# Patient Record
Sex: Male | Born: 1940 | Race: White | Hispanic: No | Marital: Married | Smoking: Former smoker
Health system: Southern US, Community
[De-identification: ages and names within clinical notes are randomized; demographics above are authoritative.]

## PROBLEM LIST (undated history)

## (undated) DIAGNOSIS — I10 Essential (primary) hypertension: Secondary | ICD-10-CM

## (undated) DIAGNOSIS — G4730 Sleep apnea, unspecified: Secondary | ICD-10-CM

## (undated) DIAGNOSIS — K219 Gastro-esophageal reflux disease without esophagitis: Secondary | ICD-10-CM

## (undated) DIAGNOSIS — K2270 Barrett's esophagus without dysplasia: Secondary | ICD-10-CM

## (undated) DIAGNOSIS — I2510 Atherosclerotic heart disease of native coronary artery without angina pectoris: Secondary | ICD-10-CM

## (undated) DIAGNOSIS — IMO0001 Reserved for inherently not codable concepts without codable children: Secondary | ICD-10-CM

## (undated) DIAGNOSIS — F329 Major depressive disorder, single episode, unspecified: Secondary | ICD-10-CM

## (undated) DIAGNOSIS — E119 Type 2 diabetes mellitus without complications: Secondary | ICD-10-CM

## (undated) DIAGNOSIS — Z8739 Personal history of other diseases of the musculoskeletal system and connective tissue: Secondary | ICD-10-CM

## (undated) DIAGNOSIS — F411 Generalized anxiety disorder: Secondary | ICD-10-CM

## (undated) DIAGNOSIS — F32A Depression, unspecified: Secondary | ICD-10-CM

## (undated) DIAGNOSIS — I252 Old myocardial infarction: Secondary | ICD-10-CM

## (undated) DIAGNOSIS — E785 Hyperlipidemia, unspecified: Secondary | ICD-10-CM

## (undated) HISTORY — DX: Generalized anxiety disorder: F41.1

## (undated) HISTORY — DX: Hyperlipidemia, unspecified: E78.5

## (undated) HISTORY — DX: Atherosclerotic heart disease of native coronary artery without angina pectoris: I25.10

## (undated) HISTORY — DX: Depression, unspecified: F32.A

## (undated) HISTORY — PX: HERNIA REPAIR: SHX51

## (undated) HISTORY — PX: BACK SURGERY: SHX140

## (undated) HISTORY — DX: Gastro-esophageal reflux disease without esophagitis: K21.9

## (undated) HISTORY — PX: CARDIAC SURGERY: SHX584

## (undated) HISTORY — DX: Major depressive disorder, single episode, unspecified: F32.9

## (undated) HISTORY — DX: Reserved for inherently not codable concepts without codable children: IMO0001

## (undated) HISTORY — DX: Personal history of other diseases of the musculoskeletal system and connective tissue: Z87.39

## (undated) HISTORY — PX: CORONARY ARTERY BYPASS GRAFT: SHX141

## (undated) HISTORY — DX: Sleep apnea, unspecified: G47.30

## (undated) HISTORY — PX: ESOPHAGOGASTRODUODENOSCOPY: SHX1529

---

## 1997-04-25 ENCOUNTER — Inpatient Hospital Stay (HOSPITAL_COMMUNITY): Admission: EM | Admit: 1997-04-25 | Discharge: 1997-04-26 | Payer: Self-pay | Admitting: Emergency Medicine

## 1998-03-11 ENCOUNTER — Encounter: Payer: Self-pay | Admitting: Neurological Surgery

## 1998-03-11 ENCOUNTER — Ambulatory Visit (HOSPITAL_COMMUNITY): Admission: RE | Admit: 1998-03-11 | Discharge: 1998-03-11 | Payer: Self-pay | Admitting: Neurological Surgery

## 1998-03-18 ENCOUNTER — Ambulatory Visit (HOSPITAL_COMMUNITY): Admission: RE | Admit: 1998-03-18 | Discharge: 1998-03-18 | Payer: Self-pay | Admitting: Neurological Surgery

## 1998-03-18 ENCOUNTER — Encounter: Payer: Self-pay | Admitting: Neurological Surgery

## 1998-04-01 ENCOUNTER — Ambulatory Visit (HOSPITAL_COMMUNITY): Admission: RE | Admit: 1998-04-01 | Discharge: 1998-04-01 | Payer: Self-pay | Admitting: Neurological Surgery

## 1998-04-01 ENCOUNTER — Encounter: Payer: Self-pay | Admitting: Neurological Surgery

## 1998-04-20 ENCOUNTER — Ambulatory Visit (HOSPITAL_COMMUNITY): Admission: RE | Admit: 1998-04-20 | Discharge: 1998-04-20 | Payer: Self-pay | Admitting: Neurological Surgery

## 1998-04-20 ENCOUNTER — Encounter: Payer: Self-pay | Admitting: Neurological Surgery

## 2002-10-07 ENCOUNTER — Ambulatory Visit (HOSPITAL_COMMUNITY): Admission: RE | Admit: 2002-10-07 | Discharge: 2002-10-07 | Payer: Self-pay | Admitting: Internal Medicine

## 2003-07-29 ENCOUNTER — Ambulatory Visit (HOSPITAL_COMMUNITY): Admission: RE | Admit: 2003-07-29 | Discharge: 2003-07-29 | Payer: Self-pay | Admitting: Family Medicine

## 2005-11-15 ENCOUNTER — Ambulatory Visit: Payer: Self-pay | Admitting: Internal Medicine

## 2005-11-27 ENCOUNTER — Ambulatory Visit (HOSPITAL_COMMUNITY): Admission: RE | Admit: 2005-11-27 | Discharge: 2005-11-27 | Payer: Self-pay | Admitting: Internal Medicine

## 2005-11-27 ENCOUNTER — Encounter (INDEPENDENT_AMBULATORY_CARE_PROVIDER_SITE_OTHER): Payer: Self-pay | Admitting: *Deleted

## 2005-11-27 ENCOUNTER — Ambulatory Visit: Payer: Self-pay | Admitting: Internal Medicine

## 2006-07-24 ENCOUNTER — Ambulatory Visit (HOSPITAL_COMMUNITY): Admission: RE | Admit: 2006-07-24 | Discharge: 2006-07-24 | Payer: Self-pay | Admitting: Family Medicine

## 2006-08-01 ENCOUNTER — Ambulatory Visit (HOSPITAL_COMMUNITY): Admission: RE | Admit: 2006-08-01 | Discharge: 2006-08-01 | Payer: Self-pay | Admitting: Family Medicine

## 2007-10-29 ENCOUNTER — Ambulatory Visit (HOSPITAL_COMMUNITY): Admission: RE | Admit: 2007-10-29 | Discharge: 2007-10-29 | Payer: Self-pay | Admitting: Family Medicine

## 2007-11-14 ENCOUNTER — Ambulatory Visit (HOSPITAL_COMMUNITY): Admission: RE | Admit: 2007-11-14 | Discharge: 2007-11-14 | Payer: Self-pay | Admitting: Family Medicine

## 2008-10-27 ENCOUNTER — Ambulatory Visit (HOSPITAL_COMMUNITY): Admission: RE | Admit: 2008-10-27 | Discharge: 2008-10-27 | Payer: Self-pay | Admitting: Family Medicine

## 2010-06-09 NOTE — Consult Note (Signed)
Malik King, Malik King               ACCOUNT NO.:  [id number]   MEDICAL RECORD NO.:  [id number]           PATIENT TYPE:  AMB   LOCATION:  DAY                           FACILITY:  [facility]   PHYSICIAN:  Malik King, M.D.    DATE OF BIRTH:  02/27/1940   DATE OF CONSULTATION:  11/15/2005  DATE OF DISCHARGE:                                   CONSULTATION   REASON FOR CONSULTATION:  History of Barrett's esophagus.   REQUESTING PHYSICIAN:  Malik A. Gerda Diss, MD   HISTORY OF PRESENT ILLNESS:  Patient is a 70 year old Caucasian gentleman  with history of Barrett's esophagus who is due for a followup EGD.  His last  EGD was September 2004, at which time he had short-segment Barrett's  esophagus with a small sliding hiatal hernia.  The total length of Barrett's  is no more than 20 mm.  There was no evidence of dysplasia on biopsies.  He  states his heartburn is well controlled, denies any dysphagia or  odynophagia, nausea/vomiting, abdominal pain, constipation, melena or rectal  bleeding.  His stools are loose on Metformin, and they are trying to find a  different medication for him.  Since we saw the patient, he had a  colonoscopy April 2006 at [facility] by Dr. Sydnee King, which was unremarkable.  A study was done apparently for diarrhea  and weight loss.   CURRENT MEDICATIONS:  1. Multivitamin daily.  2. Aspirin 325 mg daily.  3. Simvastatin 80 mg daily.  4. Isosorbide 30 mg daily.  5. Metoprolol 50 mg b.i.d.  6. Talopram 40 mg daily.  7. Omeprazole 20 mg daily.  8. Lasix 40 mg daily.  9. Metformin 1000 mg in the morning, 500 mg in the evening.  10.Amlodipine 10 mg daily.  11.Aleve 4-6 daily p.r.n. for questionable muscle strain.   ALLERGIES:  NO KNOWN DRUG ALLERGIES.   PAST MEDICAL HISTORY:  History of coronary artery disease status post 3-  vessel CABG in 1998.  He had TMR in 1999, 4 stents placed in 2001 and  another TMR procedure in 2002.  He has  hypertension, diabetes mellitus, had  back surgery, inguinal hernia repair, hemorrhoidectomy, cataract  extractions, skin cancer removed from his right hand and tonsillectomy.  He  has depression.  Last colonoscopies and EGD is as outlined above.   FAMILY HISTORY:  Mother died of rheumatic fever, age 49, complications from  a blood clot/stroke.  Father had his first MI at age 58, died of CAD at age  56.  Another brother died of heart disease at age 31.  He had a grandfather  who died from heart disease, age 78.  Maternal aunt and maternal cousin both  have colon cancer.   SOCIAL HISTORY:  Married, no children.  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  He quit smoking one week ago, no alcohol  use.   REVIEW OF SYSTEMS:  See HPI for GI and constitutional.  He denies any weight  loss currently or recently.  Cardiopulmonary:  No chest pain  or shortness of  breath.   PHYSICAL EXAMINATION:  VITAL SIGNS:  Weight 199.5, height 5 feet, 8 inches,  temp was 98.1, blood pressure 118/70, pulse 64.  GENERAL:  Pleasant, mildly obese Caucasian male in no acute distress.  SKIN:  Warm and dry, no jaundice.  HEENT:  Sclerae nonicteric.  Oropharyngeal mucosa moist and pink.  No  lesions, erythema or exudate.  He has upper and lower dentures.  No  lymphadenopathy, thyromegaly.  CHEST:  Lungs are clear to auscultation.  CARDIAC:  Regular rate and rhythm.  Normal S1, S2.  No murmurs, rubs or  gallops.  ABDOMEN:  Positive bowel sounds, soft, nontender, nondistended.  No  organomegaly or masses.  No rebound tenderness or guarding, no abdominal  bruits or hernias.  EXTREMITIES:  No edema.   IMPRESSION:  [name] is a 70 year old gentleman with chronic GERD  complicated by short-segment Barrett's esophagus.  He is due for a repeat  EGD for surveillance purposes.   PLAN:  1. EGD in the near future.  2. He will hold his aspirin for 3 days prior to procedure.  3. Continue  Prilosec dose before.   I would like to thank Malik King for allowing [place] to take part in the care  of this patient.      Malik King, P.A.      Malik King, M.D.  Electronically Signed    LL/MEDQ  D:  11/15/2005  T:  11/16/2005  Job:  [id number]   cc:   Malik Picket A. Gerda Diss, MD  Fax: [phone]

## 2010-06-09 NOTE — H&P (Signed)
NAME:  Malik King, Malik King                    ACCOUNT NO.:  [id number]   MEDICAL RECORD NO.:  [id number]                  PATIENT TYPE:   LOCATION:                                       FACILITY:   PHYSICIAN:  Lionel December, M.D.                 DATE OF BIRTH:  02/13/1940   DATE OF ADMISSION:  DATE OF DISCHARGE:                                HISTORY & PHYSICAL   OFFICE NOTE   PRESENTING COMPLAINT:  Chronic GERD.  Patient also interested in screening  colonoscopy.   HISTORY OF PRESENT ILLNESS:  This patient is a 70 year old Caucasian male, a  patient of Dr. Lilyan Punt, who is here for scheduled visit.  He is well  known to me; however, he has not been seen in our office since January 1999.  He was diagnosed with Barrett's esophagus by Dr. Audry Pili of [facility] back  in 1997.  His last EGD was in June 2002 at [facility].  According to the patient  his Barrett's esophagus has remained stable.  He states that it is difficult  for him to go to [facility] and he, therefore, wanted to have this test done here.  He denies heartburn or dysphagia.  He was having problems with hoarseness  and dry cough. 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  He is feeling better.  He denies abdominal pain, melena, or rectal bleeding.  His bowels move  regularly.  He has a good appetite and has not lost any weight recently.  The patient also is interested in undergoing colonoscopy for screening  purposes.  He denies chest pain, orthopnea or PND.   CURRENT MEDICATIONS:  1. MVI daily.  2. ASA 325 mg daily.  3. Simvastatin 80 mg daily.  4. Isosorbide 20 mg t.i.d.  5. Felodipine 10 mg daily.  6. Metoprolol 50 mg b.i.d.  7. Citalopram.  8. Hydrobromide 40 mg daily.  9. Omeprazole 20 mg b.i.d.   PAST MEDICAL HISTORY:  Medical problems include hypertension,  hyperlipidemia, chronic GERD complicated by Barrett's esophagus, and  coronary  artery disease.  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 He had another MI in November 1999 when he was evaluated at  [facility] and had TMR.  This procedure was repeated in July 2000.  He states that  he has done well since then.  His last noninvasive testing was in December  2003, which according to the patient was normal.  It was done in [place],  [place] where he is presently living.   He has had a tonsillectomy and inguinal herniorrhaphy.  He also had  hemorrhoidectomy years ago.  He also has depression well-controlled with  therapy. His last colonoscopy was in 1986 and he had flexible sigmoidoscopy  in May 1993.   ALLERGIES:  None known.   FAMILY  HISTORY:  Mother died of rheumatic heart disease at age 63.  Father  had MI at age 68 and died of CAD at age 52. One brother has had CABG.  Two  brothers and one sister in good health.  He is married but does not have any  children.  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  He is not disabled.  He states that since his last cardiac intervention he has felt a lot better  and he is working part-time as a Electrical engineer, usually over the weekends.  He smoked all-in-all for 20 years, but quit in 1998.  He does not drink  alcohol.  He does chew tobacco.   PHYSICAL EXAMINATION:  GENERAL: A pleasant, well-developed, mildly obese,  Caucasian male who is in no acute distress.  VITAL SIGNS:  He weighs 210-1/2 pounds and he is 5 feet 8 inches tall.  Pulse 68 per minute.  Blood pressure 100/60.  He is afebrile.  Conjunctivae  are pink.  Sclerae are nonicteric.  Oropharyngeal mucosa is normal.  He has  upper and lower dentures in place.  NECK:  Without masses or thyromegaly.  Carotids are 2+ bilaterally and  without bruits.  CARDIOVASCULAR: Cardiac exam with regular rhythm.  Normal S1 and S2.  No  murmur or gallop noted.  LUNGS: Clear to auscultation.  ABDOMEN:  Full, soft, and nontender without organomegaly or masses.   RECTAL:  Examination is deferred.  He does not have clubbing. He has trace  edema to his right ankle.   ASSESSMENT:  This patient is a 70 year old Caucasian male who has chronic  gastroesophageal reflux disease complicated by short-segment Barrett's  esophagus.  His last EGD was over 2 years ago at [facility].  He is due to  surveillance exam.   He is average risk for colorectal carcinoma and his last colonoscopy was 18  years ago and FS was 11 years ago.   PLAN:  He will continue present therapy.  He will undergo surveillance EGD  along with screening colonoscopy at [facility] in the near future.  I have reviewed  the procedure and risks with the patient and he is agreeable.                                               Lionel December, M.D.    NR/MEDQ  D:  09/21/2002  T:  09/21/2002  Job:  [id number]   cc:   Lorin Picket A. Gerda Diss, M.D.  [address]  [place]  [place] [address]  Fax: [phone]

## 2010-06-09 NOTE — Op Note (Signed)
NAMEMARSTON, Malik King               ACCOUNT NO.:  [id number]   MEDICAL RECORD NO.:  [id number]          PATIENT TYPE:  AMB   LOCATION:  DAY                           FACILITY:  [facility]   PHYSICIAN:  Lionel December, M.D.    DATE OF BIRTH:  [birth year]   DATE OF PROCEDURE:  11/27/2005  DATE OF DISCHARGE:                                 OPERATIVE REPORT   PROCEDURE:  Surveillance esophagogastroduodenoscopy.   INDICATIONS:  [name] is a 70 year old Caucasian male with a history of GERD  complicated by a short segment of Barrett's esophagus who is here for  surveillance examination.  He is on antireflux measures and a PPI; and his  symptoms are well-controlled.  His last exam was in September 2004.  Procedure and risks were reviewed with the patient and informed consent was  obtained.   MEDS FOR CONSCIOUS SEDATION:  Benzocaine spray for oropharyngeal topical  anesthesia Demerol 25 mg IV Versed 6 mg IV.   FINDINGS:  Procedure performed in endoscopy suite.  The patient's vital  signs and O2 saturations were monitored during procedure and remained  stable.  The patient was placed in the left lateral recumbent position and  Olympus videoscope was passed via oropharynx without any difficulty into  esophagus.   ESOPHAGUS:  Mucosa of the proximal and middle segments were normal.  Distally there was a finger-like extension of Barrett's mucosa, proximal  margin which was at 39.  GE junction was at 41; and hiatus at 43.  There  were two other islands of Barrett's mucosa which were much shorter but  wider.  Comparison was made to pictures from 3 years ago; and there was only  slight change in these shorter patches close to the GE junction.  There was  no ulceration or mass noted.  Biopsy was taken of the patch of Barrett's  mucosa on the way out.   STOMACH:  It was empty and distended very well insufflation.  Folds of  proximal stomach were normal.  Examination of the mucosa at body, antrum,  and  pyloric channel as well as angularis, fundus, and cardia was normal.   DUODENUM:  Bulbar mucosa was normal.  Scope was passed into the second part  of the duodenum where mucosa and folds were normal.   Endoscope was withdrawn.  The patient tolerated the procedure well.   FINAL DIAGNOSIS:  1. Short segment Barrett's esophagus which is not much changed since the      previous exam of over 3 years ago.  Biopsy taken to rule out dysplasia.  2. Small sliding hiatal hernia.   RECOMMENDATION:  He will continue antireflux measures and a PPI as before.  I will be contacting patient results of biopsy; and will consider next exam  in 3 years.      Lionel December, M.D.  Electronically Signed     NR/MEDQ  D:  11/27/2005  T:  11/27/2005  Job:  [id number]   cc:   Lorin Picket A. Gerda Diss, MD  Fax: [phone]

## 2010-06-09 NOTE — Op Note (Signed)
NAME:  Malik King, Malik King                         ACCOUNT NO.:  [id number]   MEDICAL RECORD NO.:  [id number]                   PATIENT TYPE:  AMB   LOCATION:  DAY                                  FACILITY:  [facility]   PHYSICIAN:  Lionel December, M.D.                 DATE OF BIRTH:  01/23/1940   DATE OF PROCEDURE:  10/07/2002  DATE OF DISCHARGE:                                 OPERATIVE REPORT   PROCEDURE:  Esophagogastroduodenoscopy followed by total colonoscopy.   ENDOSCOPIST:  Lionel December, M.D.   INDICATIONS:  Malik King is a 70 year old Caucasian male with history of chronic  GERD complicated by Barrett's esophagus.  His last 2 exams were at [facility].  His last exam was 2 years ago.  He wants to have this examined locally.  He  is also undergoing screening colonoscopy. He has intermittent rectal  discharge.  The procedure and risks were reviewed with the patient and  informed consent was obtained.   PREOPERATIVE MEDICATIONS:  Cetacaine spray for oropharyngeal topical  anesthesia, Demerol 25 mg IV and Versed 10 mg IV in divided dose.   FINDINGS:  Procedure was performed in endoscopy suite.  The patient's vital  signs and O2 saturation were monitored during the procedure and remained  stable.   PROCEDURE #1: ESOPHAGOGASTRODUODENOSCOPY:  The patient was placed in the  left lateral recumbent position and Olympus videoscope was passed via the  oropharynx without any difficulty into the esophagus.   ESOPHAGUS:  Mucosa of the esophagus was normal except distally he had a wide  band of Barrett-type mucosa.  The proximal margin was at 36 cm from the  incisors.  Gastroesophageal junction was noted to be at 38 cm and  diaphragmatic hiatus was at 40.  There was a short rim of Barrett-type  mucosa circumferentially above the GE junction.  There was a tiny island on  the left side which was no more than 3 mm.  Pictures were taken.  Multiple  biopsies were taken from this segment on the way out.   STOMACH:  It was empty and distended very well with insufflation.  The folds  of the proximal stomach were normal.  Examination of the mucosa at body,  antrum, pyloric channel, as well as angularis, fundus, and cardia were  normal.   DUODENUM:  Examination of the bulb and second part of the duodenum was also  normal.   Endoscope was withdrawn and the patient was prepared for procedure #2.   TOTAL COLONOSCOPY:  Rectal examination was performed.  No abnormality noted  on external or digital exam.   Olympus videoscope was placed in the rectum and advanced under vision into  the sigmoid colon and beyond.  Preparation was satisfactory. Scope was  passed to the cecum which was identified by ileocecal valve and appendiceal  orifice.  As the scope was withdrawn the mucosa was, once again, carefully  examined  and was normal throughout.  The rectal mucosa similarly was normal.   The scope was retroflexed to examine anorectal junction and moderate sized  hemorrhoids were noted below the dentate line. The endoscope was  straightened and withdrawn.  The patient tolerated the procedures well.   FINAL DIAGNOSES:  1. Short segment Barrett's esophagus with small sliding hiatal hernia.     Total length of Barrett's segment is no more than 20 mm.  Biopsies taken     looking for dysplasia.  2. Normal examination of the stomach and first and second part of the     duodenum.  3. Normal colonoscopy except moderate sized external hemorrhoids.   RECOMMENDATIONS:  1. He will continue antireflux measures and omeprazole at 20 mg p.o. b.i.d.  2. We will try him on Citrucel 1 tablespoonful daily and see if he could     stop his intermittent rectal discharge.  3. I will be contacting the patient with biopsy results and further     recommendations.                                               Lionel December, M.D.    NR/MEDQ  D:  10/07/2002  T:  10/07/2002  Job:  [id number]   cc:   Lorin Picket A. Gerda Diss, M.D.   [address]  [place]  [place] [address]  Fax: [phone]

## 2010-09-04 ENCOUNTER — Other Ambulatory Visit: Payer: Self-pay | Admitting: Family Medicine

## 2010-09-04 DIAGNOSIS — I281 Aneurysm of pulmonary artery: Secondary | ICD-10-CM

## 2010-09-11 ENCOUNTER — Ambulatory Visit (HOSPITAL_COMMUNITY)
Admission: RE | Admit: 2010-09-11 | Discharge: 2010-09-11 | Disposition: A | Discharge status: Home / Self Care | Payer: Medicare Other | Source: Ambulatory Visit | Attending: Family Medicine | Admitting: Family Medicine

## 2010-09-11 DIAGNOSIS — K8020 Calculus of gallbladder without cholecystitis without obstruction: Secondary | ICD-10-CM | POA: Insufficient documentation

## 2010-09-11 DIAGNOSIS — J8410 Pulmonary fibrosis, unspecified: Secondary | ICD-10-CM | POA: Insufficient documentation

## 2010-09-11 DIAGNOSIS — J984 Other disorders of lung: Secondary | ICD-10-CM | POA: Insufficient documentation

## 2010-09-11 DIAGNOSIS — I281 Aneurysm of pulmonary artery: Secondary | ICD-10-CM

## 2010-09-11 MED ORDER — IOHEXOL 300 MG/ML  SOLN
80.0000 mL | Freq: Once | INTRAMUSCULAR | Status: AC | PRN
  Administered 2010-09-11: 80 mL via INTRAVENOUS

## 2010-11-07 LAB — POCT I-STAT CREATININE
Creatinine, Ser: 0.8
Operator id: 208401

## 2012-03-10 ENCOUNTER — Encounter (HOSPITAL_COMMUNITY): Payer: Self-pay | Admitting: *Deleted

## 2012-03-10 ENCOUNTER — Emergency Department (HOSPITAL_COMMUNITY)
Admission: EM | Admit: 2012-03-10 | Discharge: 2012-03-10 | Disposition: A | Discharge status: Home / Self Care | Payer: Medicare Other | Attending: Emergency Medicine | Admitting: Emergency Medicine

## 2012-03-10 DIAGNOSIS — R5381 Other malaise: Secondary | ICD-10-CM | POA: Insufficient documentation

## 2012-03-10 DIAGNOSIS — R531 Weakness: Secondary | ICD-10-CM

## 2012-03-10 DIAGNOSIS — E119 Type 2 diabetes mellitus without complications: Secondary | ICD-10-CM | POA: Insufficient documentation

## 2012-03-10 DIAGNOSIS — I10 Essential (primary) hypertension: Secondary | ICD-10-CM | POA: Insufficient documentation

## 2012-03-10 DIAGNOSIS — Z8719 Personal history of other diseases of the digestive system: Secondary | ICD-10-CM | POA: Insufficient documentation

## 2012-03-10 DIAGNOSIS — I252 Old myocardial infarction: Secondary | ICD-10-CM | POA: Insufficient documentation

## 2012-03-10 DIAGNOSIS — R5383 Other fatigue: Secondary | ICD-10-CM | POA: Insufficient documentation

## 2012-03-10 DIAGNOSIS — R630 Anorexia: Secondary | ICD-10-CM | POA: Insufficient documentation

## 2012-03-10 HISTORY — DX: Old myocardial infarction: I25.2

## 2012-03-10 HISTORY — DX: Essential (primary) hypertension: I10

## 2012-03-10 HISTORY — DX: Barrett's esophagus without dysplasia: K22.70

## 2012-03-10 HISTORY — DX: Type 2 diabetes mellitus without complications: E11.9

## 2012-03-10 LAB — BASIC METABOLIC PANEL
Chloride: 95 mEq/L — ABNORMAL LOW (ref 96–112)
GFR calc Af Amer: >90 mL/min (ref >90)
GFR calc non Af Amer: 87 mL/min — ABNORMAL LOW (ref >90)
Glucose, Bld: 191 mg/dL — ABNORMAL HIGH (ref 70–99)
Potassium: 4.2 mEq/L (ref 3.5–5.1)
Sodium: 130 mEq/L — ABNORMAL LOW (ref 135–145)

## 2012-03-10 LAB — URINALYSIS, ROUTINE W REFLEX MICROSCOPIC
Bilirubin Urine: NEGATIVE
Hgb urine dipstick: NEGATIVE
Protein, ur: NEGATIVE mg/dL
pH: 5.5 (ref 5.0–8.0)

## 2012-03-10 LAB — CBC WITH DIFFERENTIAL/PLATELET
Basophils Absolute: 0 10*3/uL (ref 0.0–0.1)
Basophils Relative: 0 % (ref 0–1)
Eosinophils Relative: 2 % (ref 0–5)
HCT: 38.8 % — ABNORMAL LOW (ref 39.0–52.0)
Hemoglobin: 13.6 g/dL (ref 13.0–17.0)
Lymphs Abs: 3.5 10*3/uL (ref 0.7–4.0)
MCHC: 35.1 g/dL (ref 30.0–36.0)
MCV: 90.7 fL (ref 78.0–100.0)
Monocytes Relative: 5 % (ref 3–12)
RDW: 13.1 % (ref 11.5–15.5)
WBC: 10.6 10*3/uL — ABNORMAL HIGH (ref 4.0–10.5)

## 2012-03-10 LAB — TROPONIN I: Troponin I: <0.3 ng/mL (ref <0.30)

## 2012-03-10 NOTE — ED Notes (Signed)
Patient with no complaints at this time. Respirations even and unlabored. Skin warm/dry. Discharge instructions reviewed with patient at this time. Patient given opportunity to voice concerns/ask questions. Patient discharged at this time and left Emergency Department with steady gait.   

## 2012-03-10 NOTE — ED Notes (Signed)
Sent from Dr Cathlyn Parsons office due to abn. EKG.  No chest pain, says he feels weak

## 2012-03-10 NOTE — ED Notes (Signed)
Dr. Blinda Leatherwood made aware of completed labs.

## 2012-03-10 NOTE — ED Provider Notes (Signed)
History    This chart was scribed for Malik Crease, MD by Gerlean Ren, ED Scribe. This patient was seen in room [facility] and the patient's care was started at 3:46 PM    CSN: [id number]  Arrival date & time 03/10/12  1519   First MD Initiated Contact with Patient 03/10/12 1543      Chief Complaint  Patient presents with  . Weakness     The history is provided by the patient. No language interpreter was used.  Malik King is a 72 y.o. male with h/o MI, DM, HTN who presents to the Emergency Department sent from Dr. Fletcher Anon office complaining of unusual generalized weakness with associated decreased appetite.  Dr. Gerda Diss was concerned about EKG changes and contacted the patient's cardiologist in [place], [place]. Plan was to send the patient to the ER for cardiac enzymes, follow up with cardiologist in AM if normal.  Pt reports some occasional dizziness that has not been present today.  No recent head injuries or falls.  Pt denies chest pain, dyspnea, fever, emesis, diarrhea, difficulty swallowing.  Pt is a current smokeless tobacco user and denies alcohol use.   Past Medical History  Diagnosis Date  . MI, old   . Diabetes mellitus without complication   . Hypertension   . Barrett esophagus     Past Surgical History  Procedure Laterality Date  . Cardiac surgery    . Hernia repair    . Back surgery    . Coronary artery bypass graft      History reviewed. No pertinent family history.  History  Substance Use Topics  . Smoking status: Never Smoker   . Smokeless tobacco: Current User    Types: Chew  . Alcohol Use: No      Review of Systems  Constitutional: Positive for appetite change and fatigue. Negative for fever.  HENT: Negative for trouble swallowing.   Respiratory: Negative for shortness of breath.   Cardiovascular: Negative for chest pain and leg swelling.  Neurological: Negative for dizziness.  All other systems reviewed and are  negative.    Allergies  Review of patient's allergies indicates no known allergies.  Home Medications  No current outpatient prescriptions on file.  BP 135/79  Pulse 92  Temp(Src) 97.8 F (36.6 C) (Oral)  Resp 20  Ht 5\' 8"  (1.727 m)  Wt 180 lb (81.647 kg)  BMI 27.38 kg/m2  SpO2 99%  Physical Exam  Nursing note and vitals reviewed. Constitutional: He is oriented to person, place, and time. He appears well-developed and well-nourished.  Non-toxic appearance. He does not appear ill.  HENT:  Head: Normocephalic and atraumatic.  Nose: No mucosal edema or rhinorrhea.  Mouth/Throat: Mucous membranes are normal. No dental abscesses or edematous.  Eyes: Conjunctivae and EOM are normal.  Neck: Normal range of motion and full passive range of motion without pain. Neck supple.  Cardiovascular: Normal rate, regular rhythm and normal heart sounds.   No murmur heard. Pulmonary/Chest: Effort normal and breath sounds normal. No respiratory distress. He has no wheezes. He has no rhonchi. He exhibits no crepitus.  Abdominal: Soft. Normal appearance and bowel sounds are normal. He exhibits no distension. There is no tenderness.  Musculoskeletal: Normal range of motion. He exhibits no edema.  Moves all extremities well.   Neurological: He is alert and oriented to person, place, and time. He has normal strength.  Skin: Skin is warm, dry and intact.  Psychiatric: He has a normal mood and affect.  His speech is normal and behavior is normal. His mood appears not anxious.    ED Course  Procedures (including critical care time) DIAGNOSTIC STUDIES: Oxygen Saturation is 99% on room air, normal by my interpretation.     Date: 03/10/2012  Rate: 92  Rhythm: normal sinus rhythm  QRS Axis: normal  Intervals: normal  ST/T Wave abnormalities: normal  Conduction Disutrbances: none  Narrative Interpretation: unremarkable      COORDINATION OF CARE: 3:50 PM- Patient informed of clinical course  including CBC, b-met, troponin, and urinalysis, understands medical decision-making process, and agrees with plan.  Labs Reviewed - No data to display No results found.   Diagnosis: Generalized Weakness    MDM  Patient sent to the ER by Dr. Gerda Diss for further management. Patient has been experiencing fatigue and weakness today without any chest pain, heart palpitations or shortness of breath. Dr. Gerda Diss performed an EKG which he felt was abnormal and sent to the ER. Prior to sending the patient to the ER, Dr. Gerda Diss to discuss the management of the patient with the patient's cardiologist in [place]. It was felt that the patient should have cardiac enzymes and to be followed up in the office if normal, indicative abnormal.  At arrival to the ER, patient's symptoms have resolved. EKG here in the ER is normal. There were nonspecific ST elevations in V2 and V3 on the outpatient EKG that were not present on the current EKG. Troponin was negative. As patient is back to his normal baseline, never had chest pain and his EKG is normal here, it is felt he can be discharged and followup with his cardiologist as has been arranged.  I personally performed the services described in this documentation, which was scribed in my presence. The recorded information has been reviewed and is accurate.         Malik Crease, MD 03/10/12 [phone]

## 2012-03-11 ENCOUNTER — Other Ambulatory Visit (HOSPITAL_COMMUNITY): Payer: Self-pay | Admitting: Family Medicine

## 2012-03-11 DIAGNOSIS — R634 Abnormal weight loss: Secondary | ICD-10-CM

## 2012-03-11 DIAGNOSIS — Z87891 Personal history of nicotine dependence: Secondary | ICD-10-CM

## 2012-03-12 ENCOUNTER — Ambulatory Visit (HOSPITAL_COMMUNITY)
Admission: RE | Admit: 2012-03-12 | Discharge: 2012-03-12 | Disposition: A | Discharge status: Home / Self Care | Payer: Medicare Other | Source: Ambulatory Visit | Attending: Family Medicine | Admitting: Family Medicine

## 2012-03-12 ENCOUNTER — Other Ambulatory Visit (HOSPITAL_COMMUNITY): Payer: Self-pay | Admitting: Family Medicine

## 2012-03-12 DIAGNOSIS — Z951 Presence of aortocoronary bypass graft: Secondary | ICD-10-CM | POA: Insufficient documentation

## 2012-03-12 DIAGNOSIS — R634 Abnormal weight loss: Secondary | ICD-10-CM | POA: Insufficient documentation

## 2012-03-12 DIAGNOSIS — R630 Anorexia: Secondary | ICD-10-CM | POA: Insufficient documentation

## 2012-03-12 DIAGNOSIS — J984 Other disorders of lung: Secondary | ICD-10-CM | POA: Insufficient documentation

## 2012-03-12 DIAGNOSIS — Z87891 Personal history of nicotine dependence: Secondary | ICD-10-CM

## 2012-03-12 MED ORDER — IOHEXOL 300 MG/ML  SOLN
100.0000 mL | Freq: Once | INTRAMUSCULAR | Status: AC | PRN
  Administered 2012-03-12: 100 mL via INTRAVENOUS

## 2012-04-10 ENCOUNTER — Other Ambulatory Visit: Payer: Self-pay | Admitting: *Deleted

## 2012-04-10 MED ORDER — HYDROCODONE-ACETAMINOPHEN 5-325 MG PO TABS: 1.0000 | ORAL_TABLET | ORAL | Status: DC | PRN

## 2012-04-12 ENCOUNTER — Other Ambulatory Visit: Payer: Self-pay | Admitting: *Deleted

## 2012-04-12 MED ORDER — HYDROCODONE-ACETAMINOPHEN 7.5-325 MG PO TABS: 1.0000 | ORAL_TABLET | ORAL | Status: DC | PRN

## 2012-04-17 ENCOUNTER — Other Ambulatory Visit: Payer: Self-pay | Admitting: Family Medicine

## 2012-06-17 ENCOUNTER — Encounter: Payer: Self-pay | Admitting: *Deleted

## 2012-06-19 ENCOUNTER — Ambulatory Visit (INDEPENDENT_AMBULATORY_CARE_PROVIDER_SITE_OTHER): Payer: Medicare Other | Admitting: Family Medicine

## 2012-06-19 ENCOUNTER — Encounter: Payer: Self-pay | Admitting: Family Medicine

## 2012-06-19 VITALS — BP 126/78 | HR 80 | Ht 68.0 in | Wt 177.2 lb

## 2012-06-19 DIAGNOSIS — F329 Major depressive disorder, single episode, unspecified: Secondary | ICD-10-CM

## 2012-06-19 DIAGNOSIS — F32A Depression, unspecified: Secondary | ICD-10-CM

## 2012-06-19 DIAGNOSIS — E119 Type 2 diabetes mellitus without complications: Secondary | ICD-10-CM

## 2012-06-19 DIAGNOSIS — F3289 Other specified depressive episodes: Secondary | ICD-10-CM

## 2012-06-19 DIAGNOSIS — R634 Abnormal weight loss: Secondary | ICD-10-CM

## 2012-06-19 DIAGNOSIS — G894 Chronic pain syndrome: Secondary | ICD-10-CM

## 2012-06-19 NOTE — Patient Instructions (Addendum)
Please improve your nutritional / calorie intake   B12 use 2,000 mcg one daily , you can get this at the drug store without a prescription  Stop metformin (the other name for this is Glucophage) this could be contributing to your nausea and weight loss  The intestinal doctor at the [place] may need to do a gastric emptying test as well as a EGD. Please let us know what test and results of any test at the [facility]  Your A1C was 6.4 which is great ( this is why we are stopping your metformin)  PSA in Feb 0.67  Weight August 2012-192             April 2014      194              Nov 2013      185              August 2013   192  Feb 2014       180               May 2014      177  Come back here in 3 months

## 2012-06-19 NOTE — Progress Notes (Signed)
  Subjective:    Patient ID: Malik King, male    DOB: 1940-02-02, 72 y.o.   MRN: [id number]  Diabetes He presents for his follow-up diabetic visit. He has type 2 diabetes mellitus. His disease course has been improving. Pertinent negatives for hypoglycemia include no dizziness or seizures. Associated symptoms include fatigue. Pertinent negatives for diabetes include no chest pain. There are no hypoglycemic complications. Symptoms are improving. There are no diabetic complications. Risk factors for coronary artery disease include diabetes mellitus. Current diabetic treatment includes insulin injections and oral agent (monotherapy). He is compliant with treatment all of the time. His weight is stable. He is following a generally healthy diet. When asked about meal planning, he reported none.  denies low spells Poor appetite ,under a lot of stress, denies rectal bleeding Has intermittent nausea , mild fatigue Denies abusing his meds. FMH benign Soc- wife had recent stroke    Review of Systems  Constitutional: Positive for fatigue. Negative for fever and activity change (not active).  HENT: Negative for congestion and rhinorrhea.   Respiratory: Negative for cough, choking and chest tightness.   Cardiovascular: Negative for chest pain and leg swelling.  Gastrointestinal: Positive for nausea. Negative for abdominal pain and abdominal distention.  Endocrine: Negative for cold intolerance.  Neurological: Negative for dizziness, seizures and light-headedness.       Objective:   Physical Exam  Vitals reviewed. Constitutional: He appears well-developed.  HENT:  Head: Normocephalic.  Neck: Neck supple. No thyromegaly present.  Cardiovascular: Normal rate, regular rhythm and normal heart sounds.   No murmur heard. Pulmonary/Chest: Effort normal. No respiratory distress. He has no wheezes.  Musculoskeletal: He exhibits no edema.  Lymphadenopathy:    He has no cervical adenopathy.   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 Doesn't abuse the meds, helps him function DM- stable, A1C very good, stop metformin Weight loss- related to deprtession? Stress? Possibly Metformin causing issue. Stop metformin. [facility] Has appt already Had CT of chest and Abd recently looked good. PSA nl within past year.

## 2012-07-14 ENCOUNTER — Other Ambulatory Visit: Payer: Self-pay | Admitting: Family Medicine

## 2012-07-14 NOTE — Telephone Encounter (Signed)
May refill this +3 additional refills 

## 2012-07-15 NOTE — Telephone Encounter (Signed)
Rx called into [facility] system

## 2012-09-14 ENCOUNTER — Other Ambulatory Visit: Payer: Self-pay | Admitting: Family Medicine

## 2012-09-15 ENCOUNTER — Other Ambulatory Visit: Payer: Self-pay | Admitting: *Deleted

## 2012-09-15 NOTE — Telephone Encounter (Signed)
Ok for this and 4 refills

## 2012-09-19 ENCOUNTER — Encounter: Payer: Self-pay | Admitting: Family Medicine

## 2012-09-19 ENCOUNTER — Ambulatory Visit (INDEPENDENT_AMBULATORY_CARE_PROVIDER_SITE_OTHER): Payer: Medicare Other | Admitting: Family Medicine

## 2012-09-19 VITALS — BP 122/72 | Ht 68.0 in | Wt 175.6 lb

## 2012-09-19 DIAGNOSIS — E119 Type 2 diabetes mellitus without complications: Secondary | ICD-10-CM

## 2012-09-19 LAB — POCT GLYCOSYLATED HEMOGLOBIN (HGB A1C): Hemoglobin A1C: 6.4

## 2012-09-19 NOTE — Progress Notes (Signed)
  Subjective:    Patient ID: Malik King, male    DOB: August 31, 1940, 72 y.o.   MRN: [id number]  HPI Here today for check up.   Has no concerns.  His A1c looks very good. He denies abusing any of his medication. Does not smoke tries to eat properly tries to take his medicines past medical history heart disease family history heart disease social does not smoke  Review of Systems Denies chest tightness denies pressure pain denies nausea vomiting diarrhea. Denies swelling in the legs.    Objective:   Physical Exam Lungs clear no crackles, heart is regular. Abdomen is soft extremities no edema skin warm dry neurologic grossly normal       Assessment & Plan:  #1 we will decrease good control half tablet twice daily. I think this will help him. We need to avoid low sugars. Diabetes very good control #2 heart disease stable followup with cardiologist #3 chronic pain and chronic anxiety patient states up-to-date on refills we will follow him up in approximately 3 months as he needs refills we will give these

## 2012-09-19 NOTE — Patient Instructions (Addendum)
Glipizide-(  glucotrol )- reduce down to 1/2 tablet twice a day.  Call if any problems  Recheck in early December

## 2012-09-29 ENCOUNTER — Other Ambulatory Visit: Payer: Self-pay | Admitting: Family Medicine

## 2012-10-13 ENCOUNTER — Telehealth: Payer: Self-pay | Admitting: Family Medicine

## 2012-10-13 NOTE — Telephone Encounter (Signed)
Patient advised that the hydrocodone will require an office visit every 3 months and that he will get 3 rxs for the med at that visit to get him to the next appt. We will not be able to mail these rxs to him.

## 2012-10-13 NOTE — Telephone Encounter (Signed)
Patient says that the drug store left him a notice saying that they could not fax [place] anymore for Rx's and that we would have to mail him. He would like someone to call him back regarding this.

## 2012-11-11 ENCOUNTER — Encounter: Payer: Self-pay | Admitting: Family Medicine

## 2012-11-11 ENCOUNTER — Ambulatory Visit (INDEPENDENT_AMBULATORY_CARE_PROVIDER_SITE_OTHER): Payer: Medicare Other | Admitting: Family Medicine

## 2012-11-11 VITALS — BP 132/80 | Ht 68.0 in | Wt 174.0 lb

## 2012-11-11 DIAGNOSIS — R634 Abnormal weight loss: Secondary | ICD-10-CM

## 2012-11-11 DIAGNOSIS — E119 Type 2 diabetes mellitus without complications: Secondary | ICD-10-CM

## 2012-11-11 DIAGNOSIS — G894 Chronic pain syndrome: Secondary | ICD-10-CM

## 2012-11-11 DIAGNOSIS — Z23 Encounter for immunization: Secondary | ICD-10-CM

## 2012-11-11 MED ORDER — HYDROCODONE-ACETAMINOPHEN 7.5-325 MG PO TABS: ORAL_TABLET | ORAL | Status: DC

## 2012-11-11 NOTE — Progress Notes (Signed)
  Subjective:    Patient ID: Malik King, male    DOB: 1940/10/15, 72 y.o.   MRN: [id number]  HPIHere for a med check. Last A1C was 6.4 on 09/19/12.  Patient patient denies any low sugar spells currently he states he is trying to do a good job of watching dietary intake. He has lost 1 pound since last being here but it is not dramatic. He denies being depressed but he does the stress because of his wife's illness. Back pain since 2008. Patient relates that the specialist is talking about the possibility of neck surgery versus ejection. He uses his pain medicine anywhere between 4 and 6 times per day he denies abusing it. Patient denies being suicidal. Patient relates heart disease stable currently. He is a former smoker. Recent lab work plus also the CT exam from earlier this year of the chest and abdomen was reviewed with the patient. Family history noncontributory. Patient relates compliance with medications. Reflux under good control. Denies chest tightness pain does get short winded with significant activity. Denies nausea vomiting diarrhea.  Flu vaccine given.     Review of Systems see above     Objective:   Physical Exam Neck no masses lungs are clear no crackles heart is regular. No murmurs. Pulse normal. Abdomen soft no guarding rebound extremities no edema skin warm dry blood pressure on recheck is good       Assessment & Plan:  #1 diabetes no low sugar spells. Encourage healthy habits. #2 hyperlipidemia labs or not indicated currently he was encouraged to continue his medicine watch diet #3 heart disease stable continue follow through with cardiologist #4 chronic pain-refills on medications given. #5 anxiety issues-refills on Xanax. He is under a lot of stress with his wife's illness but patient denies being depressed currently continue current meds recheck patient again in approximately 3 months

## 2012-12-31 ENCOUNTER — Ambulatory Visit: Payer: Medicare Other | Admitting: Family Medicine

## 2013-01-01 ENCOUNTER — Ambulatory Visit: Payer: Medicare Other | Admitting: Family Medicine

## 2013-01-28 ENCOUNTER — Encounter (INDEPENDENT_AMBULATORY_CARE_PROVIDER_SITE_OTHER): Payer: Self-pay

## 2013-01-28 ENCOUNTER — Encounter (INDEPENDENT_AMBULATORY_CARE_PROVIDER_SITE_OTHER): Payer: Self-pay | Admitting: *Deleted

## 2013-01-30 ENCOUNTER — Encounter: Payer: Self-pay | Admitting: Family Medicine

## 2013-01-30 ENCOUNTER — Ambulatory Visit (INDEPENDENT_AMBULATORY_CARE_PROVIDER_SITE_OTHER): Payer: Medicare Other | Admitting: Family Medicine

## 2013-01-30 VITALS — BP 110/68 | Ht 68.0 in | Wt 176.0 lb

## 2013-01-30 DIAGNOSIS — E119 Type 2 diabetes mellitus without complications: Secondary | ICD-10-CM

## 2013-01-30 DIAGNOSIS — I10 Essential (primary) hypertension: Secondary | ICD-10-CM

## 2013-01-30 DIAGNOSIS — G894 Chronic pain syndrome: Secondary | ICD-10-CM

## 2013-01-30 DIAGNOSIS — Z79899 Other long term (current) drug therapy: Secondary | ICD-10-CM

## 2013-01-30 LAB — HEPATIC FUNCTION PANEL
ALK PHOS: 77 U/L (ref 39–117)
ALT: 10 U/L (ref 0–53)
AST: 11 U/L (ref 0–37)
Albumin: 4.4 g/dL (ref 3.5–5.2)
BILIRUBIN DIRECT: 0.1 mg/dL (ref 0.0–0.3)
BILIRUBIN INDIRECT: 0.4 mg/dL (ref 0.0–0.9)
TOTAL PROTEIN: 6.6 g/dL (ref 6.0–8.3)
Total Bilirubin: 0.5 mg/dL (ref 0.3–1.2)

## 2013-01-30 LAB — LIPID PANEL
Cholesterol: 96 mg/dL (ref 0–200)
HDL: 37 mg/dL — ABNORMAL LOW (ref >39)
LDL CALC: 25 mg/dL (ref 0–99)
TRIGLYCERIDES: 168 mg/dL — AB (ref <150)
Total CHOL/HDL Ratio: 2.6 Ratio
VLDL: 34 mg/dL (ref 0–40)

## 2013-01-30 LAB — POCT GLYCOSYLATED HEMOGLOBIN (HGB A1C): Hemoglobin A1C: 7.9

## 2013-01-30 MED ORDER — HYDROCODONE-ACETAMINOPHEN 7.5-325 MG PO TABS: ORAL_TABLET | ORAL | Status: DC

## 2013-01-30 NOTE — Progress Notes (Signed)
   Subjective:    Patient ID: Malik King, male    DOB: 1940-09-10, 73 y.o.   MRN: [id number]  HPIDiabetic check up. No concerns. Testing blood sugar BID.  He relates his sugars are in the 100 120 range in the morning a little higher later in the day denies any particular problems  He relates his chronic pain is stable. Denies any problems with it. PMH benign  Review of Systems  Constitutional: Negative for activity change, appetite change and fatigue.  Endocrine: Negative for polydipsia and polyphagia.  Neurological: Negative for weakness.  Psychiatric/Behavioral: Negative for confusion.       Objective:   Physical Exam  Vitals reviewed. Constitutional: He appears well-nourished. No distress.  Cardiovascular: Normal rate, regular rhythm and normal heart sounds.   No murmur heard. Pulmonary/Chest: Effort normal and breath sounds normal. No respiratory distress.  Musculoskeletal: He exhibits no edema.  Lymphadenopathy:    He has no cervical adenopathy.  Neurological: He is alert.  Psychiatric: His behavior is normal.          Assessment & Plan:  Diabetes-subpar control patient was encouraged to get back into the habit of eating healthy staying physically active and try to bring his numbers down to goal preferably 120 in the morning patient understands the importance of doing this recheck the A1c again in 3 months time if not significant improvement by that point in time then add additional medicines  Chronic pain stable refills given on medications followup 3 months patient states it helps him with functioning denies abusing it

## 2013-01-31 LAB — MICROALBUMIN, URINE: Microalb, Ur: 0.5 mg/dL (ref 0.00–1.89)

## 2013-02-01 ENCOUNTER — Encounter: Payer: Self-pay | Admitting: Family Medicine

## 2013-02-23 ENCOUNTER — Other Ambulatory Visit: Payer: Self-pay | Admitting: Family Medicine

## 2013-02-23 NOTE — Telephone Encounter (Signed)
Last office visit 01-30-13

## 2013-02-23 NOTE — Telephone Encounter (Signed)
May refill x4 

## 2013-02-24 IMAGING — CT CT CHEST W/ CM
2 of 4 series · 13 of 36 positions shown, 16 images · IV contrast (Omnipaque 300)
Comparison: 10/29/2007 abdomen and pelvis CT.  Chest CT from
09/11/2010.

CT CHEST

CLINICAL DATA: Decreased appetite with weight loss.

CT CHEST, ABDOMEN AND PELVIS WITH CONTRAST
TECHNIQUE: Multidetector CT imaging of the chest, abdomen and
pelvis was performed following the standard protocol during bolus
administration of intravenous contrast.
Contrast: 100mL OMNIPAQUE IOHEXOL 300 MG/ML  SOLN

[Series 2: cap with 5.0 b40f · axial · 0.77mm/px · z∈[-639,-59]mm · 10 of 136 slices shown, 13 images]
[im 13/136  mediastinal]
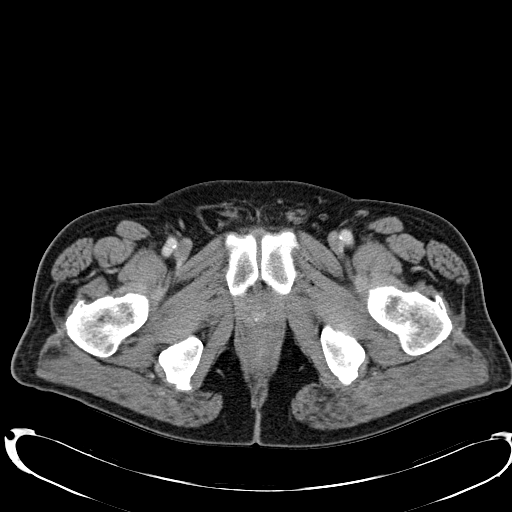
[im 13/136  lung]
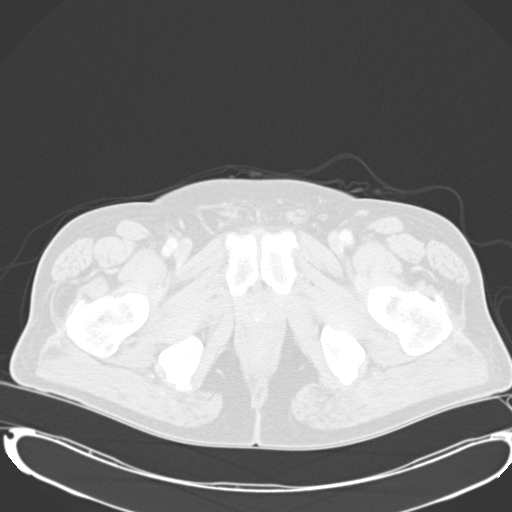
[im 26/136  lung]
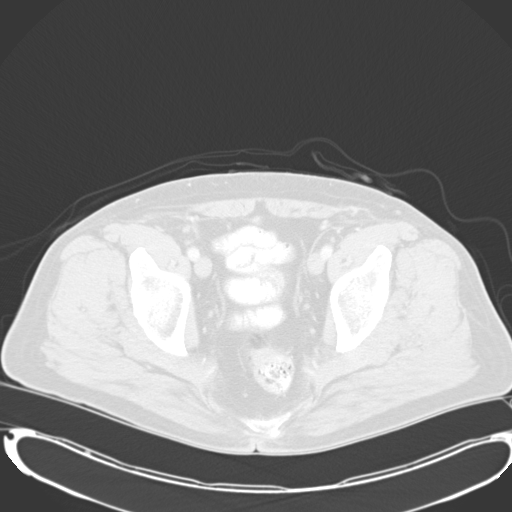
[im 39/136  lung]
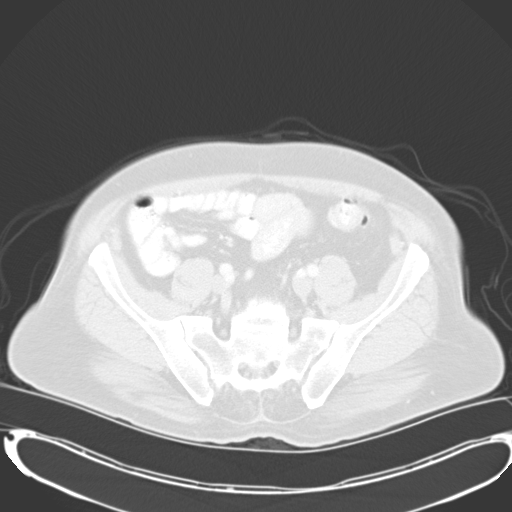
[im 52/136  lung]
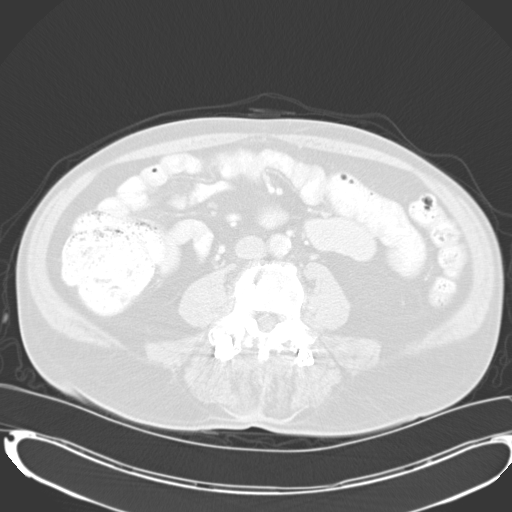
[im 65/136  mediastinal]
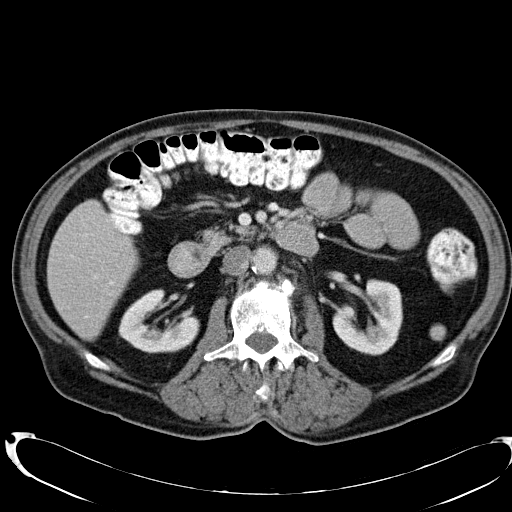
[im 65/136  lung]
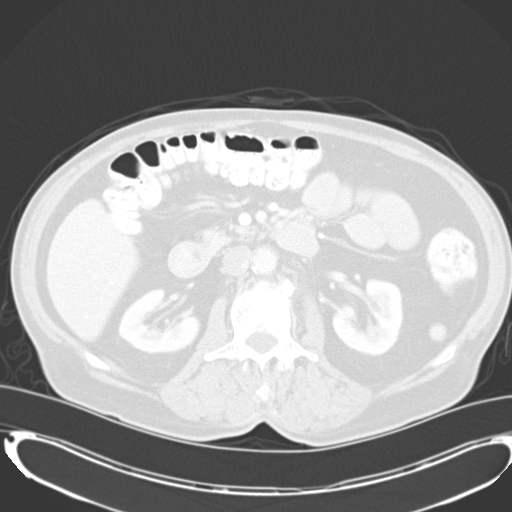
[im 78/136  lung]
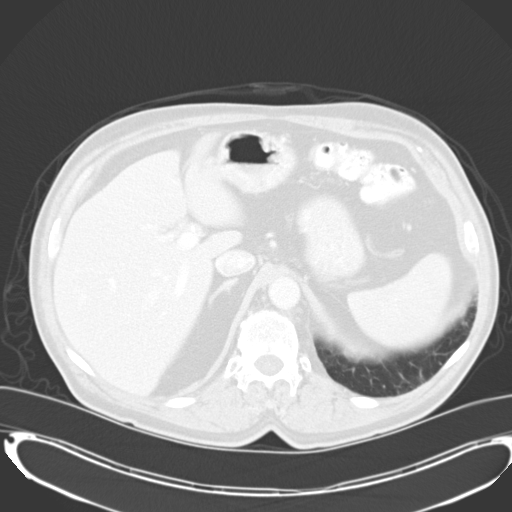
[im 91/136  lung]
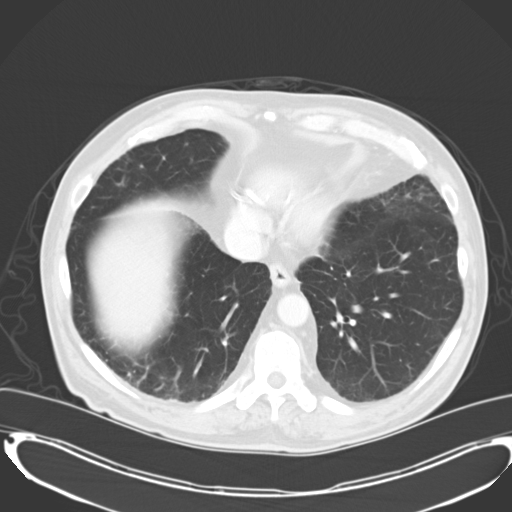
[im 103/136  lung]
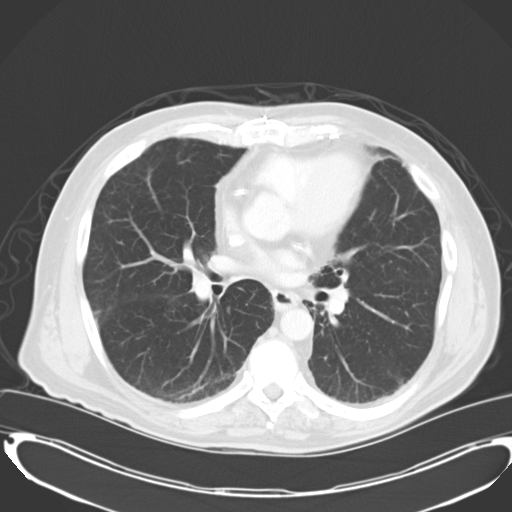
[im 116/136  mediastinal]
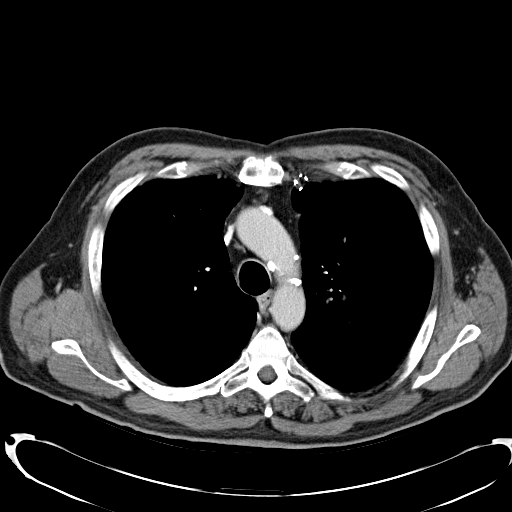
[im 116/136  lung]
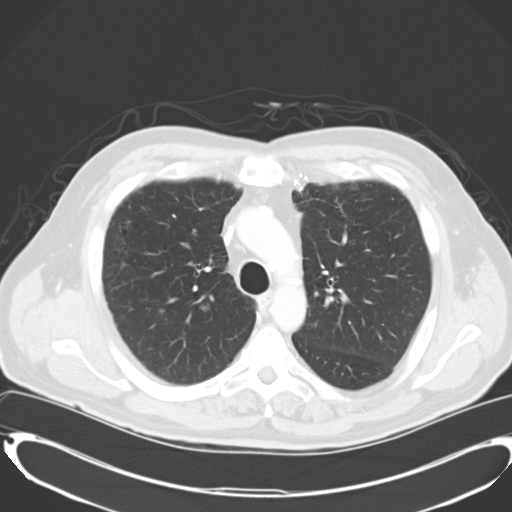
[im 129/136  lung]
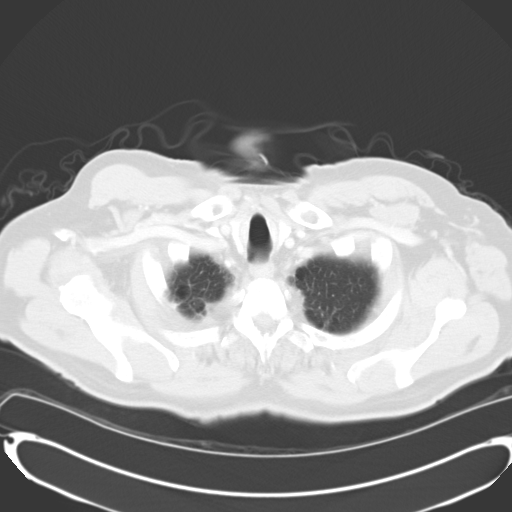

[Series 4: mpr cor post contrast (id) · coronal · 0.77mm/px · 3 of 83 slices shown]
[im 17/83  lung]
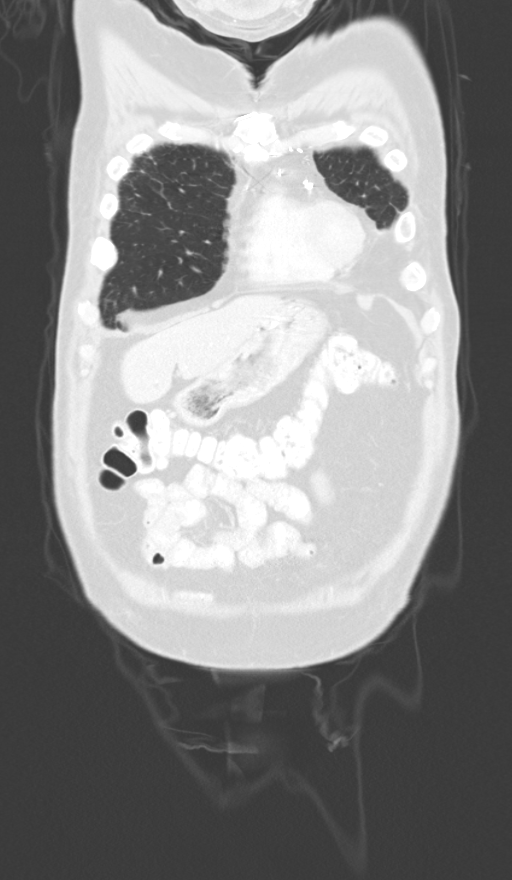
[im 33/83  lung]
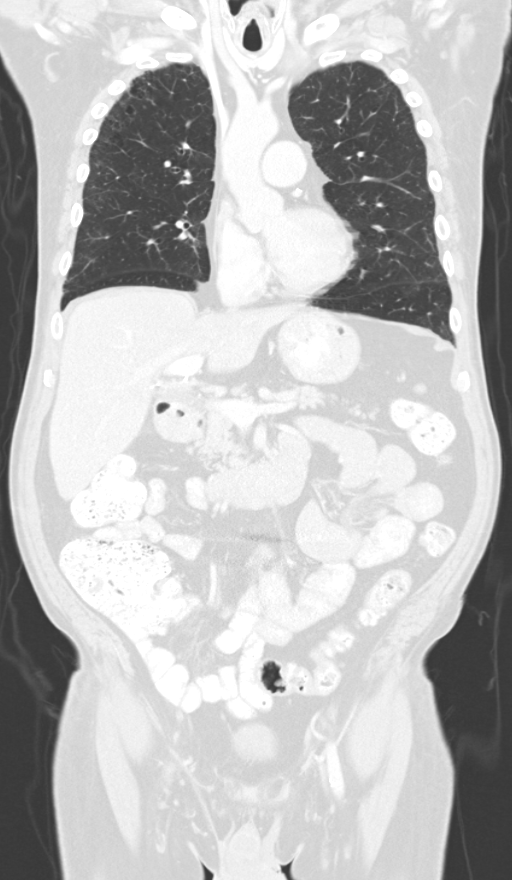
[im 50/83  lung]
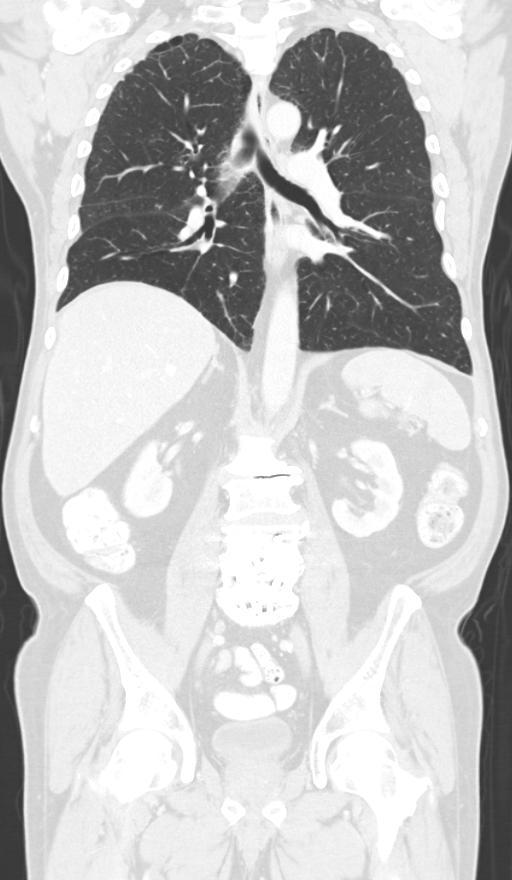

[13 of 36 positions shown; findings below may reference images not displayed]

FINDINGS: There is no axillary, mediastinal, or hilar
lymphadenopathy.  Heart size is normal.  The patient is status post
CABG.  No pericardial or pleural effusion.

Lung windows show biapical pleuroparenchymal scarring. Subpleural
honeycombing with and upper lobe predominance is unchanged.  7 mm
nodule in the left lower lobe seen on image 56 and is unchanged
since the comparison study measured 5 mm on [DATE] exam.  Imaging
features are consistent with a benign process.  No new pulmonary
nodule or mass.

Bone windows reveal no worrisome lytic or sclerotic osseous
lesions.
IMPRESSION: Stable CT chest.  No new or progressive findings.

CT ABDOMEN AND PELVIS
FINDINGS: The liver and spleen are unremarkable.  The stomach,
duodenum, pancreas, and adrenal glands are normal.  The patient is
status post cholecystectomy.  Small low density area in the
gallbladder fossa may be related to granulation tissue or
postsurgical scarring.  Kidneys are unremarkable.

No abdominal aortic aneurysm.  No free fluid or lymphadenopathy in
the abdomen.

Imaging through the pelvis shows no free intraperitoneal fluid.
Prostatic enlargement is noted.  The bladder is nondistended.  No
substantial diverticular change in the left colon.  No colonic
diverticulitis.  The terminal ileum is normal.  The appendix is
normal.

Right inguinal hernia contains only fat.

Bone windows reveal no worrisome lytic or sclerotic osseous
lesions.  The patient is status post lower lumbar fusion.
IMPRESSION: No acute findings in the abdomen or pelvis.  No findings to explain
the patient's history of decreased appetite and weight loss.

## 2013-05-01 ENCOUNTER — Encounter: Payer: Self-pay | Admitting: Family Medicine

## 2013-05-01 ENCOUNTER — Ambulatory Visit (INDEPENDENT_AMBULATORY_CARE_PROVIDER_SITE_OTHER): Payer: Medicare Other | Admitting: Family Medicine

## 2013-05-01 VITALS — BP 122/78 | Ht 68.0 in | Wt 177.0 lb

## 2013-05-01 DIAGNOSIS — Z23 Encounter for immunization: Secondary | ICD-10-CM

## 2013-05-01 DIAGNOSIS — G894 Chronic pain syndrome: Secondary | ICD-10-CM

## 2013-05-01 DIAGNOSIS — E119 Type 2 diabetes mellitus without complications: Secondary | ICD-10-CM

## 2013-05-01 DIAGNOSIS — I10 Essential (primary) hypertension: Secondary | ICD-10-CM | POA: Insufficient documentation

## 2013-05-01 LAB — POCT GLYCOSYLATED HEMOGLOBIN (HGB A1C): Hemoglobin A1C: 6.6

## 2013-05-01 MED ORDER — GLIPIZIDE 5 MG PO TABS: 2.5000 mg | ORAL_TABLET | Freq: Every day | ORAL | Status: DC

## 2013-05-01 MED ORDER — HYDROCODONE-ACETAMINOPHEN 7.5-325 MG PO TABS: ORAL_TABLET | ORAL | Status: DC

## 2013-05-01 NOTE — Progress Notes (Signed)
   Subjective:    Patient ID: Janeece AgeeJames W Mckeever, male    DOB: [birth year], 73 y.o.   MRN: [id number]  Diabetes He presents for his follow-up diabetic visit. He has type 2 diabetes mellitus. There are no hypoglycemic associated symptoms. Pertinent negatives for hypoglycemia include no confusion, dizziness or headaches. There are no diabetic associated symptoms. Pertinent negatives for diabetes include no chest pain, no fatigue, no polydipsia, no polyphagia and no weakness. Current diabetic treatment includes insulin injections and oral agent (dual therapy). (Pt says sugar will range from 70-200"s)   Long discussion held regarding his low back pain he is having significant pain discomfort have any his pain medicine anywhere between 4 and 5 times per day This patient was seen today for chronic pain  The medication list was reviewed and updated.  Discussion was held with the patient regarding compliance with pain medication. The patient was advised the importance of maintaining medication and not using illegal substances with these. The patient was educated that we can provide 3 monthly scripts for their medication, it is their responsibility to follow the instructions. Discussion was held with the patient to make sure they're not having significant side effects. Patient is aware that pain medications are meant to minimize the severity of the pain to allow their pain levels to improve to allow for better function. They are aware of that pain medications cannot totally remove their pain.      Review of Systems  Constitutional: Negative for activity change, appetite change and fatigue.  HENT: Negative for congestion.   Respiratory: Negative for cough and shortness of breath.   Cardiovascular: Negative for chest pain.  Gastrointestinal: Negative for abdominal pain.  Endocrine: Negative for polydipsia and polyphagia.  Genitourinary: Negative for difficulty urinating.  Musculoskeletal: Positive for  arthralgias and back pain.  Neurological: Negative for dizziness, weakness and headaches.  Psychiatric/Behavioral: Negative for confusion.       Objective:   Physical Exam  Vitals reviewed. Constitutional: He appears well-nourished. No distress.  Cardiovascular: Normal rate, regular rhythm and normal heart sounds.   No murmur heard. Pulmonary/Chest: Effort normal and breath sounds normal. No respiratory distress.  Abdominal: Soft.  Musculoskeletal: He exhibits no edema.  Lymphadenopathy:    He has no cervical adenopathy.  Neurological: He is alert.  Psychiatric: His behavior is normal.          Assessment & Plan:  #1 diabetes-A1c looks great. Actually it looks too good. I would recommend that he's decrease glipizide to a half tablet once in the morning. Also encourage him to try to eat a little bit better.  #2 situational stress related to his wife being in rehabilitation patient denies being depressed he is on antidepressant  #3 chronic back pain 3 prescriptions were written. 30 days apart. Followup in 3 months cautioned drowsiness not to over use.  #4 weight is stable but patient does need to do a better job of eating healthy.

## 2013-05-26 ENCOUNTER — Other Ambulatory Visit: Payer: Self-pay | Admitting: Family Medicine

## 2013-06-28 ENCOUNTER — Other Ambulatory Visit: Payer: Self-pay | Admitting: Family Medicine

## 2013-06-30 NOTE — Telephone Encounter (Signed)
Refill times 4 

## 2013-07-03 ENCOUNTER — Telehealth: Payer: Self-pay | Admitting: Family Medicine

## 2013-07-03 NOTE — Telephone Encounter (Signed)
This patient called to let us know that his wife passed away. Apparently she had a blood clot and died from complications. I talked with him at length expressed our condolences. For this I highly recommended that he followup with his psychiatric counselor and he can more than easily call us any time to talk or come sit down and talk. He voiced understanding and appreciation.

## 2013-07-03 NOTE — Telephone Encounter (Signed)
I talked with patient at length. He called stating that his wife died. She died of complications from a blood clot 5 days ago. I advised him to talk with his psychiatric counselor and he can more than easily come see us any time to sit down and talk. We expressed a lot of words of sadness. Patient does have family support. He expressed appreciation for me to call him regarding his wife's death.

## 2013-07-28 ENCOUNTER — Encounter: Payer: Self-pay | Admitting: Family Medicine

## 2013-07-28 ENCOUNTER — Ambulatory Visit (INDEPENDENT_AMBULATORY_CARE_PROVIDER_SITE_OTHER): Payer: Medicare Other | Admitting: Family Medicine

## 2013-07-28 VITALS — BP 120/70 | Ht 68.0 in | Wt 180.4 lb

## 2013-07-28 DIAGNOSIS — G894 Chronic pain syndrome: Secondary | ICD-10-CM

## 2013-07-28 DIAGNOSIS — E119 Type 2 diabetes mellitus without complications: Secondary | ICD-10-CM

## 2013-07-28 DIAGNOSIS — I10 Essential (primary) hypertension: Secondary | ICD-10-CM

## 2013-07-28 LAB — POCT GLYCOSYLATED HEMOGLOBIN (HGB A1C): HEMOGLOBIN A1C: 6

## 2013-07-28 MED ORDER — HYDROCODONE-ACETAMINOPHEN 7.5-325 MG PO TABS: ORAL_TABLET | ORAL | Status: DC

## 2013-07-28 NOTE — Progress Notes (Signed)
   Subjective:    Patient ID: Malik King, male    DOB: 1940-02-15, 73 y.o.   MRN: [id number]  Diabetes He presents for his follow-up diabetic visit. He has type 2 diabetes mellitus. His disease course has been stable. There are no hypoglycemic associated symptoms. There are no diabetic associated symptoms. There are no hypoglycemic complications. Symptoms are stable. There are no diabetic complications. There are no known risk factors for coronary artery disease. Current diabetic treatment includes insulin injections and oral agent (dual therapy). He is compliant with treatment all of the time.   Last A1C was 05/01/13 and it is too early to repeat today.  We went ahead and repeated the A1c. I realize Medicare will not reimburse for this patient lives too far to come back in a few days Patient states that he has no other concerns at this time.  This patient was seen today for chronic pain  The medication list was reviewed and updated.   -Compliance with pain medication: He he is compliant with his medicine  The patient was advised the importance of maintaining medication and not using illegal substances with these.  Refills needed: 3 refills needed today  The patient was educated that we can provide 3 monthly scripts for their medication, it is their responsibility to follow the instructions.  Side effects or complications from medications: No side effects from medication  Patient is aware that pain medications are meant to minimize the severity of the pain to allow their pain levels to improve to allow for better function. They are aware of that pain medications cannot totally remove their pain.  Due for UDT ( at least once per year) : This fall       Review of Systems Patient denies chest tightness pressure pain shortness breath nausea vomiting diarrhea    Objective:   Physical Exam  Lungs are clear hearts regular pulse normal extremities no edema foot exam normal neck no  masses      Assessment & Plan:  #1 diabetes A1c actually looks very good today. Unfortunately Medicare will cover it we will have to cover the cost.  #2 chronic pain-3 prescriptions given patient uses medication responsibly it does help him function he denies abusing the medicine  #3 depression-she has been dealing with sadness since his wife's death over a month ago but he denies being suicidal he does not feel he needs to see a counselor he is going in brief counseling at his [facility]  #4 heart disease-his heart disease is stable he sees a cardiologist every 6 months  #5 HTN-blood pressure is stable.

## 2013-10-26 ENCOUNTER — Ambulatory Visit (INDEPENDENT_AMBULATORY_CARE_PROVIDER_SITE_OTHER): Payer: Medicare Other | Admitting: Family Medicine

## 2013-10-26 ENCOUNTER — Encounter: Payer: Self-pay | Admitting: Family Medicine

## 2013-10-26 VITALS — BP 124/74 | Ht 68.0 in | Wt 188.0 lb

## 2013-10-26 DIAGNOSIS — E118 Type 2 diabetes mellitus with unspecified complications: Secondary | ICD-10-CM

## 2013-10-26 DIAGNOSIS — Z23 Encounter for immunization: Secondary | ICD-10-CM

## 2013-10-26 DIAGNOSIS — E119 Type 2 diabetes mellitus without complications: Secondary | ICD-10-CM

## 2013-10-26 DIAGNOSIS — I10 Essential (primary) hypertension: Secondary | ICD-10-CM

## 2013-10-26 DIAGNOSIS — G894 Chronic pain syndrome: Secondary | ICD-10-CM

## 2013-10-26 DIAGNOSIS — Z125 Encounter for screening for malignant neoplasm of prostate: Secondary | ICD-10-CM

## 2013-10-26 LAB — POCT GLYCOSYLATED HEMOGLOBIN (HGB A1C): HEMOGLOBIN A1C: 6.5

## 2013-10-26 MED ORDER — ALPRAZOLAM 1 MG PO TABS: ORAL_TABLET | ORAL | Status: DC

## 2013-10-26 MED ORDER — HYDROCODONE-ACETAMINOPHEN 7.5-325 MG PO TABS: ORAL_TABLET | ORAL | Status: DC

## 2013-10-26 NOTE — Progress Notes (Signed)
   Subjective:    Patient ID: Malik King, male    DOB: [birth year], 73 y.o.   MRN: [id number]  Diabetes He presents for his follow-up diabetic visit. He has type 2 diabetes mellitus. Pertinent negatives for hypoglycemia include no confusion. Pertinent negatives for diabetes include no chest pain, no fatigue, no polydipsia, no polyphagia and no weakness. He is compliant with treatment all of the time. He is currently taking insulin at bedtime. Insulin injections are given by patient. He never participates in exercise. His home blood glucose trend is fluctuating dramatically (low at night and high in the am). He does not see a podiatrist.Eye exam is current.  Flu vaccine and prevnar 13 today. Refill on hydrocodone. Pt takes as prescribed. No side effects.  Please see discussion of all his chronic health issues which were covered at the bottom of the assessment plan 25 minutes spent with patient discussing all the significant issues  Review of Systems  Constitutional: Negative for activity change, appetite change and fatigue.  HENT: Negative for congestion.   Respiratory: Negative for cough.   Cardiovascular: Negative for chest pain.  Gastrointestinal: Negative for abdominal pain.  Endocrine: Negative for polydipsia and polyphagia.  Neurological: Negative for weakness.  Psychiatric/Behavioral: Negative for confusion.       Objective:   Physical Exam  Vitals reviewed. Constitutional: He appears well-nourished. No distress.  Cardiovascular: Normal rate, regular rhythm and normal heart sounds.   No murmur heard. Pulmonary/Chest: Effort normal and breath sounds normal. No respiratory distress.  Musculoskeletal: He exhibits no edema.  Lymphadenopathy:    He has no cervical adenopathy.  Neurological: He is alert.  Psychiatric: His behavior is normal.          Assessment & Plan:  #1 anxiety-patient has been dealing with anxiety since the loss of his wife. He denies abusing Xanax  states it helps him with his nerves he takes it 2 or 3 times per day  #2 patient follows up with cardiology on a regular basis is taking his blood pressure and heart medicine and Plavix.  #3 hyperlipidemia his cardiologist follows lipid profile he knows to watch his diet he denies any problems with his medicine  #4 pain control-he states he is doing good job taking his medicine takes as directed does not exceed the number per day allowing. States it helps him function. He states it does not cause drowsiness  #5 diabetes good control overall denies low sugar spells tries to eat properly he is trying to stay physically active.  #6 reflux past takes Protonix on a regular basis when he does not take it it does cause significant issues  #7 insomnia trazodone helps with his means as well as sleep

## 2013-10-26 NOTE — Patient Instructions (Signed)
Stop Glipizide  Monitor your sugars twice daily  Let us know if low spells or above 200

## 2013-10-27 LAB — BASIC METABOLIC PANEL
BUN: 11 mg/dL (ref 6–23)
CALCIUM: 10.1 mg/dL (ref 8.4–10.5)
CO2: 23 meq/L (ref 19–32)
CREATININE: 0.78 mg/dL (ref 0.50–1.35)
Chloride: 94 mEq/L — ABNORMAL LOW (ref 96–112)
Glucose, Bld: 164 mg/dL — ABNORMAL HIGH (ref 70–99)
Potassium: 4.6 mEq/L (ref 3.5–5.3)
Sodium: 132 mEq/L — ABNORMAL LOW (ref 135–145)

## 2013-10-27 LAB — PSA, MEDICARE: PSA: 0.65 ng/mL (ref <=4.00)

## 2013-12-22 LAB — HM DIABETES EYE EXAM

## 2013-12-23 ENCOUNTER — Other Ambulatory Visit: Payer: Self-pay | Admitting: Family Medicine

## 2014-01-26 ENCOUNTER — Ambulatory Visit (INDEPENDENT_AMBULATORY_CARE_PROVIDER_SITE_OTHER): Payer: Medicare Other | Admitting: Family Medicine

## 2014-01-26 ENCOUNTER — Encounter: Payer: Self-pay | Admitting: Family Medicine

## 2014-01-26 VITALS — BP 138/88 | Ht 68.0 in | Wt 190.0 lb

## 2014-01-26 DIAGNOSIS — I10 Essential (primary) hypertension: Secondary | ICD-10-CM

## 2014-01-26 DIAGNOSIS — G894 Chronic pain syndrome: Secondary | ICD-10-CM

## 2014-01-26 DIAGNOSIS — E119 Type 2 diabetes mellitus without complications: Secondary | ICD-10-CM

## 2014-01-26 LAB — POCT GLYCOSYLATED HEMOGLOBIN (HGB A1C): HEMOGLOBIN A1C: 7.6

## 2014-01-26 MED ORDER — HYDROCODONE-ACETAMINOPHEN 7.5-325 MG PO TABS: ORAL_TABLET | ORAL | Status: DC

## 2014-01-26 NOTE — Progress Notes (Signed)
   Subjective:    Patient ID: Malik King, male    DOB: 11-09-1940, 74 y.o.   MRN: [id number]  Diabetes He presents for his follow-up diabetic visit. He has type 2 diabetes mellitus. Pertinent negatives for hypoglycemia include no confusion. Pertinent negatives for diabetes include no chest pain, no fatigue, no polydipsia, no polyphagia and no weakness. He is compliant with treatment all of the time. He never participates in exercise. His home blood glucose trend is increasing rapidly. His breakfast blood glucose range is generally >200 mg/dl. He does not see a podiatrist.Eye exam is current.  A1C 7.6. pt states no other concerns.  Patient has chronic pain and discomfort takes her medicine for pain control helps him with functioning. He denies abusing it. Denies any other issues with it. He states he has not been having heart disease issues lately he follows up with a cardiologist. Has history hypertension blood pressure today was good compliance with medicine good. Has history of anxiety takes his medicine denies abusing it helps him he states it does not cause drowsiness. Review of Systems  Constitutional: Negative for activity change, appetite change and fatigue.  HENT: Negative for congestion.   Respiratory: Negative for cough.   Cardiovascular: Negative for chest pain.  Gastrointestinal: Negative for abdominal pain.  Endocrine: Negative for polydipsia and polyphagia.  Neurological: Negative for weakness.  Psychiatric/Behavioral: Negative for confusion.       Objective:   Physical Exam Diabetic foot exam normal Lungs clear hearts regular Pulse normal BP good abdomen soft Extremities no edema Neurologic grossly normal       Assessment & Plan:  Diabetes poor control he needs to do a better job were increasing insulin watch diet better since the passing of his wife he has not been eating properly discussed at length the importance of doing this [place]  [facility] states that it looked good.  Chronic pain discomfort he denies abusing medicine I don't believe he has we called and verified that he was only getting his medicine through us. 3 prescriptions were given he will follow-up in 3 months.

## 2014-03-20 ENCOUNTER — Other Ambulatory Visit: Payer: Self-pay | Admitting: Family Medicine

## 2014-03-24 ENCOUNTER — Other Ambulatory Visit: Payer: Self-pay | Admitting: Family Medicine

## 2014-03-24 MED ORDER — TRAZODONE HCL 100 MG PO TABS: 100.0000 mg | ORAL_TABLET | Freq: Every day | ORAL | Status: DC

## 2014-03-24 NOTE — Addendum Note (Signed)
Addended byOneal Deputy: Sharmila Wrobleski D on: 03/24/2014 01:29 PM   Modules accepted: Orders

## 2014-04-16 ENCOUNTER — Other Ambulatory Visit: Payer: Self-pay | Admitting: Family Medicine

## 2014-04-27 ENCOUNTER — Encounter: Payer: Self-pay | Admitting: Family Medicine

## 2014-04-27 ENCOUNTER — Ambulatory Visit (INDEPENDENT_AMBULATORY_CARE_PROVIDER_SITE_OTHER): Payer: Medicare Other | Admitting: Family Medicine

## 2014-04-27 VITALS — BP 118/80 | Ht 68.0 in | Wt 189.0 lb

## 2014-04-27 DIAGNOSIS — E119 Type 2 diabetes mellitus without complications: Secondary | ICD-10-CM

## 2014-04-27 DIAGNOSIS — G894 Chronic pain syndrome: Secondary | ICD-10-CM

## 2014-04-27 LAB — POCT GLYCOSYLATED HEMOGLOBIN (HGB A1C): HEMOGLOBIN A1C: 6.2

## 2014-04-27 MED ORDER — HYDROCODONE-ACETAMINOPHEN 7.5-325 MG PO TABS: ORAL_TABLET | ORAL | Status: DC

## 2014-04-27 NOTE — Progress Notes (Signed)
   Subjective:    Patient ID: Malik King, male    DOB: 1940-10-25, 74 y.o.   MRN: [id number]  Diabetes He presents for his follow-up diabetic visit. He has type 2 diabetes mellitus. There are no hypoglycemic associated symptoms. Pertinent negatives for hypoglycemia include no confusion. There are no diabetic associated symptoms. Pertinent negatives for diabetes include no chest pain, no fatigue, no polydipsia, no polyphagia and no weakness. Symptoms are stable. Current diabetic treatment includes insulin injections and oral agent (monotherapy). He is compliant with treatment all of the time. His highest blood glucose is >200 mg/dl. He does not see a podiatrist.Eye exam is current.   Pt went to [facility] 2 weeks ago for pneumonia.   he states he's feeling much better is finishing up his antibiotics.   He does state that he saw the cardiologist few weeks ago for his heart they're managing his medications for this and he says he is doing well.  Review of Systems  Constitutional: Negative for activity change, appetite change and fatigue.  HENT: Negative for congestion.   Respiratory: Negative for cough.   Cardiovascular: Negative for chest pain.  Gastrointestinal: Negative for abdominal pain.  Endocrine: Negative for polydipsia and polyphagia.  Neurological: Negative for weakness.  Psychiatric/Behavioral: Negative for confusion.       Objective:   Physical Exam  Constitutional: He appears well-nourished. No distress.  Cardiovascular: Normal rate, regular rhythm and normal heart sounds.   No murmur heard. Pulmonary/Chest: Effort normal and breath sounds normal. No respiratory distress.  Musculoskeletal: He exhibits no edema.  Lymphadenopathy:    He has no cervical adenopathy.  Neurological: He is alert.  Psychiatric: His behavior is normal.  Vitals reviewed.         Assessment & Plan:   diabetes-his A1c overall looks good I talked with him at length. He often skips  breafast and lunch. I told him that is not a good idea would diabetes and been on insulin that he runs a risk of low sugar spells. He denies low sugar spells. He will do a better job at taking in some breakfast and lunch   since the loss of his wife family helps him out. He feels he is getting along okay. His family does driving for him from [place] to here.    chronic back pain I do not feel further surgery will help him. Pain medication seems to be helping he takes anywhere from 4-5 times per day. 3 prescriptions were written for him. I do not feel that he is misusing it. Follow-up in 3 months

## 2014-05-05 ENCOUNTER — Other Ambulatory Visit: Payer: Self-pay | Admitting: Family Medicine

## 2014-05-05 NOTE — Telephone Encounter (Signed)
May have this +4 refills 

## 2014-05-06 NOTE — Telephone Encounter (Signed)
This +5 refills 

## 2014-05-10 ENCOUNTER — Other Ambulatory Visit: Payer: Self-pay | Admitting: Family Medicine

## 2014-06-06 ENCOUNTER — Other Ambulatory Visit: Payer: Self-pay | Admitting: Family Medicine

## 2014-06-07 NOTE — Telephone Encounter (Signed)
Last seen 04/27/14

## 2014-07-01 ENCOUNTER — Other Ambulatory Visit: Payer: Self-pay | Admitting: Family Medicine

## 2014-07-28 ENCOUNTER — Ambulatory Visit (INDEPENDENT_AMBULATORY_CARE_PROVIDER_SITE_OTHER): Payer: Medicare Other | Admitting: Family Medicine

## 2014-07-28 ENCOUNTER — Encounter: Payer: Self-pay | Admitting: Family Medicine

## 2014-07-28 VITALS — BP 142/88 | Ht 68.0 in | Wt 181.5 lb

## 2014-07-28 DIAGNOSIS — G894 Chronic pain syndrome: Secondary | ICD-10-CM | POA: Diagnosis not present

## 2014-07-28 DIAGNOSIS — Z79891 Long term (current) use of opiate analgesic: Secondary | ICD-10-CM | POA: Diagnosis not present

## 2014-07-28 DIAGNOSIS — E119 Type 2 diabetes mellitus without complications: Secondary | ICD-10-CM

## 2014-07-28 DIAGNOSIS — I10 Essential (primary) hypertension: Secondary | ICD-10-CM

## 2014-07-28 DIAGNOSIS — R634 Abnormal weight loss: Secondary | ICD-10-CM | POA: Diagnosis not present

## 2014-07-28 LAB — POCT GLYCOSYLATED HEMOGLOBIN (HGB A1C): HEMOGLOBIN A1C: 6.9

## 2014-07-28 MED ORDER — HYDROCODONE-ACETAMINOPHEN 7.5-325 MG PO TABS: ORAL_TABLET | ORAL | Status: DC

## 2014-07-28 MED ORDER — ALPRAZOLAM 1 MG PO TABS: 1.0000 mg | ORAL_TABLET | Freq: Three times a day (TID) | ORAL | Status: DC

## 2014-07-28 NOTE — Progress Notes (Signed)
   Subjective:    Patient ID: Malik King, male    DOB: May 13, 1940, 74 y.o.   MRN: [id number]  Diabetes He presents for his follow-up diabetic visit. He has type 2 diabetes mellitus. MedicAlert identification noted. He has not had a previous visit with a dietitian. He does not see a podiatrist.Eye exam is current (Patient unsure of the date).   Patient Hemoglobin A1c 6.9 this visit.  Patient received Pain contract this visit. This patient was seen today for chronic pain  The medication list was reviewed and updated.   -Compliance with pain medication: yes  The patient was advised the importance of maintaining medication and not using illegal substances with these.  Refills needed: yes  The patient was educated that we can provide 3 monthly scripts for their medication, it is their responsibility to follow the instructions.  Side effects or complications from medications: none  Patient is aware that pain medications are meant to minimize the severity of the pain to allow their pain levels to improve to allow for better function. They are aware of that pain medications cannot totally remove their pain.  Due for UDT ( at least once per year) : yes  Review of Systems Relates low back pain denies leg pain denies chest tightness pressure shortness of breath    Objective:   Physical Exam Lungs are clear hearts regular pulse normal extremities no edema skin warm dry neurologic grossly normal yet patient slightly subdued in his discussion       Assessment & Plan:  I believe this patient is dealing with some depression continue antidepressives he [place] [facility] he needs to continue this  I have encouraged patient to reduce his Xanax as best as possible but he has severe anxiety issues and moderate anxiety when he gets out. He stated that he will try to avoid taking anxiety medicine close to bedtime in the he will try to gradually reduce the dose  Pain  medications were discussed with the patient uses anywhere between 3 and 5 a day he was instructed not taking pain pill at bedtime. He denies the medicine causing drowsiness. He is been able to do limited driving without difficulty.pain contract signed today  Patient has significant loneliness he needs to get out more in contact with family and friends and church members I believe this would help  Diabetes overall doing well continue current measures no adjustments necessary currently

## 2014-07-28 NOTE — Patient Instructions (Signed)
Xanax 1 in the am , 1/2 at afternoon and 1 in the evening  Pain med no greater than 5 in a day  Diabetes doing well  Recheck here in 3 months

## 2014-08-02 LAB — TOXASSURE SELECT 13 (MW), URINE: PDF: 0

## 2014-08-06 ENCOUNTER — Other Ambulatory Visit: Payer: Self-pay

## 2014-08-06 MED ORDER — ALPRAZOLAM 1 MG PO TABS: ORAL_TABLET | ORAL | Status: DC

## 2014-08-06 NOTE — Addendum Note (Signed)
Addended by: Jeralene PetersREWS, SHANNON R on: 08/06/2014 03:18 PM   Modules accepted: Orders

## 2014-08-13 ENCOUNTER — Other Ambulatory Visit: Payer: Self-pay | Admitting: Family Medicine

## 2014-08-23 ENCOUNTER — Telehealth: Payer: Self-pay | Admitting: Family Medicine

## 2014-08-23 NOTE — Telephone Encounter (Signed)
Pt called stating that his prescription for hydrocodone was mailed to him with the year 2017 on it and the pharmacy wont fill it without talking to the dr. The pharmacy's number is [phone] [facility] on [address]

## 2014-08-23 NOTE — Telephone Encounter (Signed)
Please call the pharmacy do what we need to do to help correct this thank you

## 2014-08-23 NOTE — Telephone Encounter (Signed)
Pharmacist called to verify it was ok to fill in 2016 and not 2017. Pharmacy to fill rx on 08/28/2014

## 2014-09-13 ENCOUNTER — Other Ambulatory Visit: Payer: Self-pay | Admitting: *Deleted

## 2014-09-13 MED ORDER — TRAZODONE HCL 100 MG PO TABS: 100.0000 mg | ORAL_TABLET | Freq: Every day | ORAL | Status: AC

## 2014-09-23 ENCOUNTER — Telehealth: Payer: Self-pay | Admitting: Family Medicine

## 2014-09-23 MED ORDER — HYDROCODONE-ACETAMINOPHEN 7.5-325 MG PO TABS: ORAL_TABLET | ORAL | Status: DC

## 2014-09-23 NOTE — Telephone Encounter (Signed)
Pt states he has lost his next refill for the 5th of Sept, wants to know if you can  Write him another one an mail it to him. Advised you were out of the office, not sure We could mail it, that he may have to come get it.   HYDROcodone-acetaminophen (NORCO) 7.5-325 MG per tablet

## 2014-09-23 NOTE — Telephone Encounter (Signed)
Ok times one cc this note thread to dr Lorin Picket

## 2014-09-28 NOTE — Telephone Encounter (Signed)
So noted, generally patient is very reliable. Obviously we will not refill another time if it is lost again

## 2014-09-30 ENCOUNTER — Telehealth: Payer: Self-pay | Admitting: Family Medicine

## 2014-09-30 NOTE — Telephone Encounter (Signed)
Pt requesting [place] to mail his refill Rx for his HYDROcodone-acetaminophen (NORCO) 7.5-325 MG per tablet  Rx is ready up front for pick up, he states that he lost previous Rx during a recent move  Please advise if OK to mail to patient

## 2014-09-30 NOTE — Telephone Encounter (Signed)
May mail to pt

## 2014-10-04 NOTE — Telephone Encounter (Signed)
Mailed today 9/12

## 2014-10-26 ENCOUNTER — Encounter: Payer: Self-pay | Admitting: Family Medicine

## 2014-10-26 ENCOUNTER — Ambulatory Visit (INDEPENDENT_AMBULATORY_CARE_PROVIDER_SITE_OTHER): Payer: Medicare Other | Admitting: Family Medicine

## 2014-10-26 ENCOUNTER — Ambulatory Visit: Payer: Medicare Other | Admitting: Family Medicine

## 2014-10-26 VITALS — BP 140/84 | Ht 68.0 in | Wt 179.4 lb

## 2014-10-26 DIAGNOSIS — Z23 Encounter for immunization: Secondary | ICD-10-CM | POA: Diagnosis not present

## 2014-10-26 DIAGNOSIS — Z79891 Long term (current) use of opiate analgesic: Secondary | ICD-10-CM

## 2014-10-26 DIAGNOSIS — M5090 Cervical disc disorder, unspecified, unspecified cervical region: Secondary | ICD-10-CM | POA: Diagnosis not present

## 2014-10-26 DIAGNOSIS — G894 Chronic pain syndrome: Secondary | ICD-10-CM

## 2014-10-26 DIAGNOSIS — E119 Type 2 diabetes mellitus without complications: Secondary | ICD-10-CM

## 2014-10-26 LAB — POCT GLYCOSYLATED HEMOGLOBIN (HGB A1C): Hemoglobin A1C: 5.6

## 2014-10-26 MED ORDER — HYDROCODONE-ACETAMINOPHEN 7.5-325 MG PO TABS: ORAL_TABLET | ORAL | Status: DC

## 2014-10-26 MED ORDER — ALPRAZOLAM 1 MG PO TABS: ORAL_TABLET | ORAL | Status: DC

## 2014-10-26 MED ORDER — INSULIN DETEMIR 100 UNIT/ML ~~LOC~~ SOLN: SUBCUTANEOUS | Status: AC

## 2014-10-26 NOTE — Progress Notes (Signed)
   Subjective:    Patient ID: Malik King, male    DOB: 09-24-1940, 74 y.o.   MRN: [id number]  Diabetes He presents for his follow-up diabetic visit. He has type 2 diabetes mellitus. No MedicAlert identification noted. He does not see a podiatrist.Eye exam is current (12/22/13).  [facility] but he comes in today stating that he did have a low sugar spells therefore we did go ahead and check A1c. He does watch what he eats but he is eating less than what he used to he eats 2 meals per day. He relates he is taking 44 units of insulin daily of the long-acting in the evening and still takes metformin. He states the [place] he is doing lab work to follow his overall blood work.  Patient has c/o of arm pain.the patient relates he is having significant pain in the forearm he relates he's also having right-sided neck pain. This is been going on for several weeks he is tried over-the-counter measures he also takes his pain medicine he states that it's feels better when his head is bent lower feels worse when his head is up. Denies any numbness or weakness in the arm.  The patient comes here for pain management. The reason why we prescribed the pain medicine he is been a long-term patient of our practice. When he moved to [facility] he could not find any doctor there who would be willing to prescribe his pain medicine. The patient has been reliable with his pain medicine he states it does not cause drowsiness. He denies drowsiness nausea vomiting or any addiction issues.  The patient does take Xanax as well as antidepressive. He states his overall moods have been doing good. He denies abuse of the Xanax and states it is not causing him any problems.  Review of Systems Patient denies excessive thirst nausea vomiting diarrhea denies fever chills rash relates right forearm pain. Denies headaches. Denies nervousness denies being depressed. Does relate back pain and joint pain.      Objective:   Physical Exam Neck he has decreased range of motion keeps his head bent downward but he can straighten up decreased range of motion flexing upward and low and rotating left and right subjected discomfort in the right forearm reflexes good strength in both arms good lungs are clear no crackles heart is regular extremities no edema     Assessment & Plan:  Chronic pain-3 prescriptions given. Patient denies abusing it follow-up in 3 months  Right arm pain with neck pain I encouraged patient to see [facility] or his family doctor in [place] so they would order x-ray and possibly scan I told him he probably has a pinched nerve he states that he would rather follow this up in [place].  Diabetes I reduced his long-acting insulin I gave him a copy of his A1c result I encouraged him to talk with the [facility] medical doctor to follow his sugars closer and reduce insulin to avoid low sugar spells  Anxiety related condition he needs to reduce his Xanax we will reduce it to 1 mg tablet no more than twice per day and I told the patient in the long run we will be reducing it even further.  Greater than 25 minutes spent with the patient covering these multiple issues.

## 2015-01-26 ENCOUNTER — Ambulatory Visit (INDEPENDENT_AMBULATORY_CARE_PROVIDER_SITE_OTHER): Payer: Medicare Other | Admitting: Family Medicine

## 2015-01-26 ENCOUNTER — Encounter: Payer: Self-pay | Admitting: Family Medicine

## 2015-01-26 VITALS — BP 138/82 | Ht 68.0 in | Wt 180.8 lb

## 2015-01-26 DIAGNOSIS — E119 Type 2 diabetes mellitus without complications: Secondary | ICD-10-CM

## 2015-01-26 DIAGNOSIS — G894 Chronic pain syndrome: Secondary | ICD-10-CM

## 2015-01-26 DIAGNOSIS — I10 Essential (primary) hypertension: Secondary | ICD-10-CM

## 2015-01-26 LAB — POCT GLYCOSYLATED HEMOGLOBIN (HGB A1C): Hemoglobin A1C: 8

## 2015-01-26 MED ORDER — ROPINIROLE HCL 0.25 MG PO TABS: 0.2500 mg | ORAL_TABLET | Freq: Every day | ORAL | Status: DC

## 2015-01-26 MED ORDER — ALPRAZOLAM 1 MG PO TABS: ORAL_TABLET | ORAL | Status: DC

## 2015-01-26 MED ORDER — HYDROCODONE-ACETAMINOPHEN 7.5-325 MG PO TABS: ORAL_TABLET | ORAL | Status: AC

## 2015-01-26 MED ORDER — HYDROCODONE-ACETAMINOPHEN 7.5-325 MG PO TABS: ORAL_TABLET | ORAL | Status: DC

## 2015-01-26 MED ORDER — ALPRAZOLAM 1 MG PO TABS: ORAL_TABLET | ORAL | Status: AC

## 2015-01-26 NOTE — Progress Notes (Signed)
Subjective:    Patient ID: Malik King, male    DOB: 23-Apr-1940, 75 y.o.   MRN: [id number]  HPI This patient was seen today for chronic pain  The medication list was reviewed and updated.   -Compliance with medication: yes  - Number patient states they take daily: 4-5 tabs a day  -when was the last dose patient took? This am  The patient was advised the importance of maintaining medication and not using illegal substances with these.  Refills needed: yes  The patient was educated that we can provide 3 monthly scripts for their medication, it is their responsibility to follow the instructions.  Side effects or complications from medications: none  Patient is aware that pain medications are meant to minimize the severity of the pain to allow their pain levels to improve to allow for better function. They are aware of that pain medications cannot totally remove their pain.  Due for UDT ( at least once per year) : UTD   patient relates use nerve pills anywhere between a half or a whole twice a day he denies using more than 1-1/2 a day states it does not cause drowsiness helps him with his anxiety  he relates he takes his pain medicine every 4 hours approximately 45 per day denies a causing drowsiness is been on this dose for a long time and it does help   patient unable to find a physician where he moved to who will take over his pain management. He is been a long-term patient here compliant with his medicines urine drug screens in the past been negative  patient understands that he should not drive if he is feeling drowsy I have also advised patient best for him to limit how much driving he does in regards to distance in should try when it all possible to drive during the day    patient is also talked to at length about diabetes importance keeping his diet under good control he no longer sees his medical doctor in [place]. He does see the cardiologist and occasionally has to go  to specialist. The patient has some family members who will look in on him his wife died. Tends to be fairly by himself he states Meals [employer] Wheels bring him and at lunch typically fixes a small medial himself he is limited in his ability to stick with a low sugar low carbohydrate diet   Review of Systems  Constitutional: Negative for activity change, appetite change and fatigue.  HENT: Negative for congestion.   Respiratory: Negative for cough.   Cardiovascular: Negative for chest pain.  Gastrointestinal: Negative for vomiting and abdominal pain.  Endocrine: Negative for polydipsia and polyphagia.  Neurological: Negative for weakness.  Psychiatric/Behavioral: Negative for confusion.       Objective:   Physical Exam   lungs clear hearts regular pulse normal BP recheck good extremities no edema diabetic foot exam normal  25 minutes was spent with the patient. Greater than half the time was spent in discussion and answering questions and counseling regarding the issues that the patient came in for today.     Assessment & Plan:   diabetes subpar control probably related to poor diet he will try to work on this he will also write down his sugar readings and he will mail them or call them into us within the next 2 weeks   patient is unfortunate in the sense that he is by himself his wife is dyed he has  few friends that check in on him but he assures me that he has some family that checks in on him by phone call or dropping in every day he denies missing his medicines he states he's taken them responsibly he states pain medicine alleviates the pain without causing drowsiness he also states that he is allowing family members to help him out when he can   Chronic pain 3 scripts written   patient unable to do fasting blood work because of his social situation we will go ahead and order what labs we can   Patient does have what appears to be restless legs he states had some at nighttime has a  hard time follow sleep causes legs feel restless we will try low-dose Requip  Anxiety related issues do not take anxiety medicine pain medicine near each other do not take them together at bedtime patient tolerates medications well

## 2015-01-27 LAB — BASIC METABOLIC PANEL
BUN / CREAT RATIO: 18 (ref 10–22)
BUN: 17 mg/dL (ref 8–27)
CO2: 21 mmol/L (ref 18–29)
CREATININE: 0.96 mg/dL (ref 0.76–1.27)
Calcium: 9.6 mg/dL (ref 8.6–10.2)
Chloride: 96 mmol/L (ref 96–106)
GFR, EST AFRICAN AMERICAN: 91 mL/min/{1.73_m2} (ref >59)
GFR, EST NON AFRICAN AMERICAN: 79 mL/min/{1.73_m2} (ref >59)
Glucose: 239 mg/dL — ABNORMAL HIGH (ref 65–99)
Potassium: 4.4 mmol/L (ref 3.5–5.2)
SODIUM: 135 mmol/L (ref 134–144)

## 2015-01-27 LAB — HEPATIC FUNCTION PANEL
ALK PHOS: 74 IU/L (ref 39–117)
ALT: 25 IU/L (ref 0–44)
AST: 17 IU/L (ref 0–40)
Albumin: 4.3 g/dL (ref 3.5–4.8)
BILIRUBIN TOTAL: 0.2 mg/dL (ref 0.0–1.2)
BILIRUBIN, DIRECT: 0.08 mg/dL (ref 0.00–0.40)
Total Protein: 6.3 g/dL (ref 6.0–8.5)

## 2015-01-27 LAB — MICROALBUMIN / CREATININE URINE RATIO
Creatinine, Urine: 92 mg/dL
MICROALB/CREAT RATIO: 8.6 mg/g creat (ref 0.0–30.0)
Microalbumin, Urine: 7.9 ug/mL

## 2015-02-01 ENCOUNTER — Telehealth: Payer: Self-pay | Admitting: Family Medicine

## 2015-02-01 NOTE — Telephone Encounter (Signed)
1/5  229           Night 308 1/6   231                    324 1/7  211                    370 1/8  128                    250 1/9   259                    327 1/10   196

## 2015-02-02 ENCOUNTER — Encounter: Payer: Self-pay | Admitting: Family Medicine

## 2015-02-03 MED ORDER — EMPAGLIFLOZIN 10 MG PO TABS: 10.0000 mg | ORAL_TABLET | Freq: Every day | ORAL | Status: AC

## 2015-02-03 NOTE — Telephone Encounter (Addendum)
Rx sent electronically to pharmacy. Patient notified. 

## 2015-02-03 NOTE — Telephone Encounter (Signed)
Add Jardiance 10 mg one daily, #30, 5 refills, monitor glucoses intermittently over the next few weeks and call or send us some readings in 2 weeks

## 2015-02-03 NOTE — Addendum Note (Signed)
Addended by: Margaretha SheffieldBROWN, Taiyana Kissler S on: 02/03/2015 04:18 PM   Modules accepted: Orders

## 2015-02-04 ENCOUNTER — Telehealth: Payer: Self-pay | Admitting: Family Medicine

## 2015-02-04 NOTE — Telephone Encounter (Signed)
empagliflozin (JARDIANCE) 10 MG TABS tablet  Pt states he went to go get this filled an his insurance is going To cost him $400 plus dollars to get this med. Do we have a  Voucher or some sort of discount or other med he can take?  [facility] [place]

## 2015-02-06 NOTE — Telephone Encounter (Signed)
unfortunately diabetes care is tremendously expensive-another option instead of Jardiance would be short acting insulin such as Humalog given at mealtimes. Typically this would be 4 units per meal as a beginning dose. It is quite possible his insurance does a better job of covering the short acting insulin. This would require him to more frequently check his insulin. I do not have any vouchers or samples here. If the patient is interested in seeing what the cost of short acting insulin is we can cancel the Jardiance and instead send in prescription for short-acting insulin. Talk with the patient see what he would like to do.

## 2015-02-07 NOTE — Telephone Encounter (Signed)
Patient states he is willing to start insulin with meals-states he knows how to do the shots and to check sugars before- would like rx sent to pharmacy and he will pick up tommorow

## 2015-02-08 ENCOUNTER — Encounter: Payer: Self-pay | Admitting: Family Medicine

## 2015-02-08 MED ORDER — INSULIN LISPRO 100 UNIT/ML (KWIKPEN): 4.0000 [IU] | PEN_INJECTOR | Freq: Three times a day (TID) | SUBCUTANEOUS | Status: AC

## 2015-02-08 NOTE — Telephone Encounter (Signed)
Patient stated he went ahead and bought one month of Jardiance for $455 dollars(but will NOT be buying anymore) and will finish that before he starts the insulin.  Patient was advised to make sure he did not take them together.

## 2015-02-08 NOTE — Telephone Encounter (Signed)
Ok so noted, letter being sent to reinforce this

## 2015-02-08 NOTE — Telephone Encounter (Signed)
Novolog 4 units subq with meals tid, please advise patient if he is having low sugar spells or other problems to notify us. He can send [place] or call us his glucose readings periodically. Also if the patient once a vial sent in in we will draw it up that is okay or if he once the pens to use that is okay as well. He may find that he will not have to take as much long-acting insulin depending on his readings

## 2015-02-22 ENCOUNTER — Telehealth: Payer: Self-pay | Admitting: Family Medicine

## 2015-02-22 NOTE — Telephone Encounter (Signed)
I would recommend that the patient switch from the tabs to the insulin no later than 10 days from now, then he needs to keep [place] updated on his glucose readings

## 2015-02-22 NOTE — Telephone Encounter (Signed)
Pt is still taking the med he got that was costly, he will switch from the  Tabs in insulin once that script is gone.  His readings are as follows AM   PM 1/12  140  305 1/13  160  247 1/14  190  260 1/15  173  294 1/16  158  302 1/17    95  149 1/18  150  179 1/19    89  197 1/20  130  278 1/21  214  207 1/22  101  243 1/23    89  Skipped 1/24  104  274 1/25  165  247 1/26    87  184 1/27    98  456 1/28  112  290 1/29  159  184 1/30    97  122 1/31  124

## 2015-02-23 NOTE — Telephone Encounter (Signed)
Notified patient that Dr. Lorin Picket recommends that the patient switch from the tabs to the insulin no later than 10 days from now, then he needs to keep [place] updated on his glucose readings. Patient verbalized understanding.

## 2015-03-13 ENCOUNTER — Encounter: Payer: Self-pay | Admitting: Family Medicine

## 2015-04-04 ENCOUNTER — Encounter: Payer: Self-pay | Admitting: *Deleted

## 2015-04-09 ENCOUNTER — Other Ambulatory Visit: Payer: Self-pay | Admitting: Family Medicine

## 2015-04-26 ENCOUNTER — Ambulatory Visit: Payer: Medicare Other | Admitting: Family Medicine

## 2015-06-05 ENCOUNTER — Other Ambulatory Visit: Payer: Self-pay | Admitting: Family Medicine

## 2015-07-05 ENCOUNTER — Other Ambulatory Visit: Payer: Self-pay | Admitting: *Deleted

## 2015-07-05 MED ORDER — ROPINIROLE HCL 0.25 MG PO TABS: ORAL_TABLET | ORAL | Status: DC

## 2015-07-11 ENCOUNTER — Other Ambulatory Visit: Payer: Self-pay | Admitting: *Deleted

## 2015-07-11 MED ORDER — ROPINIROLE HCL 0.25 MG PO TABS: ORAL_TABLET | ORAL | Status: DC

## 2015-07-12 ENCOUNTER — Other Ambulatory Visit: Payer: Self-pay | Admitting: *Deleted

## 2015-07-12 MED ORDER — ROPINIROLE HCL 0.25 MG PO TABS: ORAL_TABLET | ORAL | Status: AC

## 2015-09-09 ENCOUNTER — Other Ambulatory Visit: Payer: Self-pay | Admitting: Family Medicine

## 2015-09-09 NOTE — Telephone Encounter (Signed)
Patient is no longer getting care here he is getting care through a doctor in [place]

## 2016-11-22 DEATH — deceased
